# Patient Record
Sex: Male | Born: 1950 | Race: Black or African American | Hispanic: No | Marital: Single | Smoking: Former smoker
Health system: Southern US, Community
[De-identification: ages and names within clinical notes are randomized; demographics above are authoritative.]

## PROBLEM LIST (undated history)

## (undated) DIAGNOSIS — M1990 Unspecified osteoarthritis, unspecified site: Secondary | ICD-10-CM

---

## 2001-11-07 ENCOUNTER — Encounter: Payer: Self-pay | Admitting: Family Medicine

## 2001-11-07 ENCOUNTER — Ambulatory Visit (HOSPITAL_COMMUNITY): Admission: RE | Admit: 2001-11-07 | Discharge: 2001-11-07 | Payer: Self-pay | Admitting: Family Medicine

## 2003-07-18 ENCOUNTER — Ambulatory Visit (HOSPITAL_COMMUNITY): Admission: RE | Admit: 2003-07-18 | Discharge: 2003-07-18 | Payer: Self-pay | Admitting: Internal Medicine

## 2003-07-18 ENCOUNTER — Encounter: Payer: Self-pay | Admitting: Internal Medicine

## 2003-07-18 ENCOUNTER — Encounter (INDEPENDENT_AMBULATORY_CARE_PROVIDER_SITE_OTHER): Payer: Self-pay | Admitting: Specialist

## 2003-11-23 ENCOUNTER — Emergency Department (HOSPITAL_COMMUNITY): Admission: EM | Admit: 2003-11-23 | Discharge: 2003-11-23 | Payer: Self-pay | Admitting: Emergency Medicine

## 2004-10-25 ENCOUNTER — Ambulatory Visit: Payer: Self-pay | Admitting: Internal Medicine

## 2004-11-09 ENCOUNTER — Ambulatory Visit: Payer: Self-pay | Admitting: Internal Medicine

## 2004-11-19 ENCOUNTER — Ambulatory Visit: Payer: Self-pay | Admitting: Internal Medicine

## 2004-12-03 ENCOUNTER — Ambulatory Visit: Payer: Self-pay | Admitting: Internal Medicine

## 2004-12-24 ENCOUNTER — Ambulatory Visit: Payer: Self-pay | Admitting: Internal Medicine

## 2004-12-31 ENCOUNTER — Ambulatory Visit: Payer: Self-pay | Admitting: Internal Medicine

## 2005-01-07 ENCOUNTER — Ambulatory Visit: Payer: Self-pay | Admitting: Internal Medicine

## 2005-01-14 ENCOUNTER — Ambulatory Visit: Payer: Self-pay | Admitting: Internal Medicine

## 2005-01-21 ENCOUNTER — Ambulatory Visit: Payer: Self-pay | Admitting: Internal Medicine

## 2005-01-28 ENCOUNTER — Ambulatory Visit: Payer: Self-pay | Admitting: Internal Medicine

## 2005-02-04 ENCOUNTER — Ambulatory Visit: Payer: Self-pay | Admitting: Internal Medicine

## 2005-02-07 ENCOUNTER — Ambulatory Visit: Payer: Self-pay | Admitting: Internal Medicine

## 2005-02-11 ENCOUNTER — Ambulatory Visit: Payer: Self-pay | Admitting: Internal Medicine

## 2005-02-21 ENCOUNTER — Ambulatory Visit: Payer: Self-pay | Admitting: Internal Medicine

## 2005-02-25 ENCOUNTER — Ambulatory Visit: Payer: Self-pay | Admitting: Internal Medicine

## 2005-03-04 ENCOUNTER — Ambulatory Visit: Payer: Self-pay | Admitting: Internal Medicine

## 2005-03-11 ENCOUNTER — Ambulatory Visit: Payer: Self-pay | Admitting: Internal Medicine

## 2005-03-18 ENCOUNTER — Ambulatory Visit: Payer: Self-pay | Admitting: Internal Medicine

## 2005-03-25 ENCOUNTER — Ambulatory Visit: Payer: Self-pay | Admitting: Internal Medicine

## 2005-03-31 ENCOUNTER — Ambulatory Visit: Payer: Self-pay | Admitting: Internal Medicine

## 2005-04-08 ENCOUNTER — Ambulatory Visit: Payer: Self-pay | Admitting: Internal Medicine

## 2005-04-15 ENCOUNTER — Ambulatory Visit: Payer: Self-pay | Admitting: Internal Medicine

## 2005-04-19 ENCOUNTER — Ambulatory Visit: Payer: Self-pay | Admitting: *Deleted

## 2005-04-22 ENCOUNTER — Ambulatory Visit: Payer: Self-pay | Admitting: Internal Medicine

## 2005-04-29 ENCOUNTER — Ambulatory Visit: Payer: Self-pay | Admitting: Internal Medicine

## 2005-05-06 ENCOUNTER — Ambulatory Visit: Payer: Self-pay | Admitting: Internal Medicine

## 2005-05-13 ENCOUNTER — Ambulatory Visit: Payer: Self-pay | Admitting: Internal Medicine

## 2005-07-22 ENCOUNTER — Ambulatory Visit: Payer: Self-pay | Admitting: Internal Medicine

## 2005-07-26 ENCOUNTER — Ambulatory Visit: Payer: Self-pay | Admitting: Internal Medicine

## 2005-09-20 ENCOUNTER — Ambulatory Visit: Payer: Self-pay | Admitting: Internal Medicine

## 2007-06-26 ENCOUNTER — Emergency Department (HOSPITAL_COMMUNITY): Admission: EM | Admit: 2007-06-26 | Discharge: 2007-06-26 | Payer: Self-pay | Admitting: Emergency Medicine

## 2007-07-05 ENCOUNTER — Ambulatory Visit: Payer: Self-pay | Admitting: Internal Medicine

## 2007-09-05 ENCOUNTER — Encounter (INDEPENDENT_AMBULATORY_CARE_PROVIDER_SITE_OTHER): Payer: Self-pay | Admitting: *Deleted

## 2007-12-21 ENCOUNTER — Emergency Department (HOSPITAL_COMMUNITY): Admission: EM | Admit: 2007-12-21 | Discharge: 2007-12-21 | Payer: Self-pay | Admitting: Emergency Medicine

## 2008-01-07 ENCOUNTER — Emergency Department (HOSPITAL_COMMUNITY): Admission: EM | Admit: 2008-01-07 | Discharge: 2008-01-07 | Payer: Self-pay | Admitting: Family Medicine

## 2008-07-04 ENCOUNTER — Emergency Department (HOSPITAL_COMMUNITY): Admission: EM | Admit: 2008-07-04 | Discharge: 2008-07-04 | Payer: Self-pay | Admitting: Emergency Medicine

## 2009-03-26 ENCOUNTER — Emergency Department (HOSPITAL_COMMUNITY): Admission: EM | Admit: 2009-03-26 | Discharge: 2009-03-26 | Payer: Self-pay | Admitting: Emergency Medicine

## 2009-05-31 ENCOUNTER — Emergency Department (HOSPITAL_BASED_OUTPATIENT_CLINIC_OR_DEPARTMENT_OTHER): Admission: EM | Admit: 2009-05-31 | Discharge: 2009-05-31 | Payer: Self-pay | Admitting: Emergency Medicine

## 2009-05-31 ENCOUNTER — Ambulatory Visit: Payer: Self-pay | Admitting: Interventional Radiology

## 2009-06-15 ENCOUNTER — Ambulatory Visit: Payer: Self-pay | Admitting: Internal Medicine

## 2009-07-10 ENCOUNTER — Ambulatory Visit: Payer: Self-pay | Admitting: Internal Medicine

## 2009-09-08 ENCOUNTER — Encounter: Payer: Self-pay | Admitting: Internal Medicine

## 2010-01-27 ENCOUNTER — Ambulatory Visit: Payer: Self-pay | Admitting: Internal Medicine

## 2010-01-27 LAB — CONVERTED CEMR LAB
AST: 34 units/L (ref 0–37)
Alkaline Phosphatase: 86 units/L (ref 39–117)
BUN: 13 mg/dL (ref 6–23)
Calcium: 8.8 mg/dL (ref 8.4–10.5)
Chloride: 103 meq/L (ref 96–112)
Creatinine, Ser: 0.86 mg/dL (ref 0.40–1.50)
TIBC: 307 ug/dL (ref 215–435)
UIBC: 261 ug/dL

## 2010-03-03 ENCOUNTER — Ambulatory Visit: Payer: Self-pay | Admitting: Internal Medicine

## 2011-03-30 LAB — URINE CULTURE: Colony Count: >=100000

## 2011-03-30 LAB — COMPREHENSIVE METABOLIC PANEL
Alkaline Phosphatase: 98 U/L (ref 39–117)
BUN: 11 mg/dL (ref 6–23)
CO2: 26 mEq/L (ref 19–32)
Chloride: 101 mEq/L (ref 96–112)
Creatinine, Ser: 0.96 mg/dL (ref 0.4–1.5)
GFR calc non Af Amer: >60 mL/min (ref >60)
Glucose, Bld: 136 mg/dL — ABNORMAL HIGH (ref 70–99)
Potassium: 3.9 mEq/L (ref 3.5–5.1)
Total Bilirubin: 0.8 mg/dL (ref 0.3–1.2)

## 2011-03-30 LAB — URINE MICROSCOPIC-ADD ON

## 2011-03-30 LAB — DIFFERENTIAL
Basophils Absolute: 0 10*3/uL (ref 0.0–0.1)
Basophils Relative: 0 % (ref 0–1)
Lymphocytes Relative: 22 % (ref 12–46)
Neutro Abs: 5.7 10*3/uL (ref 1.7–7.7)
Neutrophils Relative %: 67 % (ref 43–77)

## 2011-03-30 LAB — CBC
HCT: 47.3 % (ref 39.0–52.0)
Hemoglobin: 16.3 g/dL (ref 13.0–17.0)
MCV: 93.3 fL (ref 78.0–100.0)
Platelets: 172 10*3/uL (ref 150–400)
RBC: 5.07 MIL/uL (ref 4.22–5.81)
WBC: 8.5 10*3/uL (ref 4.0–10.5)

## 2011-03-30 LAB — URINALYSIS, ROUTINE W REFLEX MICROSCOPIC
Bilirubin Urine: NEGATIVE
Nitrite: POSITIVE — AB
Specific Gravity, Urine: 1.018 (ref 1.005–1.030)
pH: 5.5 (ref 5.0–8.0)

## 2011-03-30 LAB — LIPASE, BLOOD: Lipase: 25 U/L (ref 11–59)

## 2011-06-27 ENCOUNTER — Emergency Department (HOSPITAL_COMMUNITY): Payer: Self-pay

## 2011-06-27 ENCOUNTER — Emergency Department (HOSPITAL_COMMUNITY)
Admission: EM | Admit: 2011-06-27 | Discharge: 2011-06-27 | Disposition: A | Discharge status: Home / Self Care | Payer: Self-pay | Attending: Emergency Medicine | Admitting: Emergency Medicine

## 2011-06-27 DIAGNOSIS — IMO0001 Reserved for inherently not codable concepts without codable children: Secondary | ICD-10-CM | POA: Insufficient documentation

## 2011-06-27 DIAGNOSIS — R059 Cough, unspecified: Secondary | ICD-10-CM | POA: Insufficient documentation

## 2011-06-27 DIAGNOSIS — J40 Bronchitis, not specified as acute or chronic: Secondary | ICD-10-CM | POA: Insufficient documentation

## 2011-06-27 DIAGNOSIS — K7460 Unspecified cirrhosis of liver: Secondary | ICD-10-CM | POA: Insufficient documentation

## 2011-06-27 DIAGNOSIS — R05 Cough: Secondary | ICD-10-CM | POA: Insufficient documentation

## 2011-06-27 DIAGNOSIS — K759 Inflammatory liver disease, unspecified: Secondary | ICD-10-CM | POA: Insufficient documentation

## 2011-09-16 LAB — URINALYSIS, ROUTINE W REFLEX MICROSCOPIC
Bilirubin Urine: NEGATIVE
Hgb urine dipstick: NEGATIVE
Nitrite: POSITIVE — AB
Protein, ur: NEGATIVE
Specific Gravity, Urine: 1.02
Urobilinogen, UA: 2 — ABNORMAL HIGH

## 2011-09-16 LAB — URINE CULTURE: Colony Count: >=100000

## 2011-09-16 LAB — URINE MICROSCOPIC-ADD ON

## 2013-07-23 ENCOUNTER — Emergency Department (HOSPITAL_COMMUNITY)
Admission: EM | Admit: 2013-07-23 | Discharge: 2013-07-23 | Disposition: A | Discharge status: Home / Self Care | Payer: No Typology Code available for payment source | Source: Home / Self Care

## 2013-07-23 ENCOUNTER — Encounter (HOSPITAL_COMMUNITY): Payer: Self-pay | Admitting: Emergency Medicine

## 2013-07-23 DIAGNOSIS — N402 Nodular prostate without lower urinary tract symptoms: Secondary | ICD-10-CM

## 2013-07-23 DIAGNOSIS — R3989 Other symptoms and signs involving the genitourinary system: Secondary | ICD-10-CM

## 2013-07-23 DIAGNOSIS — R39198 Other difficulties with micturition: Secondary | ICD-10-CM

## 2013-07-23 LAB — POCT URINALYSIS DIP (DEVICE)
Glucose, UA: NEGATIVE mg/dL
Protein, ur: NEGATIVE mg/dL
Specific Gravity, Urine: 1.02 (ref 1.005–1.030)
Urobilinogen, UA: 1 mg/dL (ref 0.0–1.0)

## 2013-07-23 MED ORDER — TAMSULOSIN HCL 0.4 MG PO CAPS: 0.4000 mg | ORAL_CAPSULE | Freq: Every day | ORAL | Status: DC

## 2013-07-23 MED ORDER — CEPHALEXIN 500 MG PO CAPS: 500.0000 mg | ORAL_CAPSULE | Freq: Two times a day (BID) | ORAL | Status: DC

## 2013-07-23 NOTE — ED Notes (Signed)
C/o difficulty urinating which started Sunday night.  Referral for urologist

## 2013-07-23 NOTE — ED Provider Notes (Signed)
Devon Wall is a 62 y.o. male who presents to Urgent Care today for difficulty urinating over the last several years worse recently. Patient has poor dentition and is currently on hydrocodone from his dentist. He notes on Monday he had difficulty urinating.  He had to strain to urinate. He stopped taking hydrocodone and notes that he is now able to pee more easily. He still has to strain somewhat. He denies any fevers or chills abdominal pain nausea vomiting or diarrhea. He denies any personal past history of prostate enlargement. No significant pain. Well otherwise.    PMH reviewed. Healthy otherwise History  Substance Use Topics  . Smoking status: Not on file  . Smokeless tobacco: Not on file  . Alcohol Use: Not on file   ROS as above Medications reviewed. No current facility-administered medications for this encounter.   Current Outpatient Prescriptions  Medication Sig Dispense Refill  . cephALEXin (KEFLEX) 500 MG capsule Take 1 capsule (500 mg total) by mouth 2 (two) times daily.  14 capsule  0  . tamsulosin (FLOMAX) 0.4 MG CAPS capsule Take 1 capsule (0.4 mg total) by mouth daily.  30 capsule  0    Exam:  BP 123/92  Pulse 90  Temp(Src) 98.4 F (36.9 C) (Oral)  Resp 20  SpO2 98% Gen: Well NAD HEENT: EOMI,  MMM Lungs: CTABL Nl WOB Heart: RRR no MRG Abd: NABS, NT, ND Exts: Non edematous BL  LE, warm and well perfused.  Rectal: Large firm nodule in the left lobe of the prostate. The overall prostate size is also enlarged. Nontender.   Results for orders placed during the hospital encounter of 07/23/13 (from the past 24 hour(s))  POCT URINALYSIS DIP (DEVICE)     Status: Abnormal   Collection Time    07/23/13  4:24 PM      Result Value Range   Glucose, UA NEGATIVE  NEGATIVE mg/dL   Bilirubin Urine NEGATIVE  NEGATIVE   Ketones, ur NEGATIVE  NEGATIVE mg/dL   Specific Gravity, Urine 1.020  1.005 - 1.030   Hgb urine dipstick TRACE (*) NEGATIVE   pH 6.0  5.0 - 8.0   Protein, ur NEGATIVE  NEGATIVE mg/dL   Urobilinogen, UA 1.0  0.0 - 1.0 mg/dL   Nitrite POSITIVE (*) NEGATIVE   Leukocytes, UA SMALL (*) NEGATIVE   No results found.  Assessment and Plan: 62 y.o. male with difficulty urinating with a large prostate nodule. He likely has prostate enlargement which has resulted in his difficulty urinating over the last several years. His recent difficulty urinating was likely worsened by the hydrocodone which he is no longer taking. Additionally he has what appears to be a urinary tract infection.  Plan:  Start Flomax Treat urinary tract infection empirically with Keflex and obtain a urine culture.  Refer to urology for workup of nodule.  Discussed the plan with the patient expresses understanding and agree     Rodolph Bong, MD 07/23/13 [phone]

## 2013-07-25 LAB — URINE CULTURE: Colony Count: >=100000

## 2013-07-26 ENCOUNTER — Telehealth (HOSPITAL_COMMUNITY): Payer: Self-pay | Admitting: Family Medicine

## 2013-07-26 MED ORDER — CEFDINIR 300 MG PO CAPS: 300.0000 mg | ORAL_CAPSULE | Freq: Two times a day (BID) | ORAL | Status: DC

## 2013-07-26 NOTE — ED Notes (Signed)
Attempted to get in contact with the patient regarding urine culture.  Left a message. Called in [place] as the Ecoli is resistant to Keflex.   Rodolph Bong, MD 07/26/13 [phone]

## 2013-07-28 ENCOUNTER — Telehealth (HOSPITAL_COMMUNITY): Payer: Self-pay | Admitting: *Deleted

## 2013-07-28 NOTE — ED Notes (Signed)
Urine culture: >100,000 colonies E. Coli.  Pt. treated with Keflex- resistant. 8/7 Message to Dr. Denyse Amass.  8/8 Dr. Denyse Amass changed Rx. to [facility]. and attempted to call pt.  8/10  I called pt. today but both numbers are invalid ( home number is disconnected and mobile number was verified as incorrect by person that answered the phone.)  There is no contact number listed. I wrote a letter with lab result, Rx. change, and where to pick it up.  It will go out tomorrow morning.  Dr. Denyse Amass notified. I also called [facility] and they do not have any other number to call pt. Vassie Moselle 07/28/2013

## 2014-10-09 ENCOUNTER — Emergency Department (HOSPITAL_COMMUNITY): Payer: No Typology Code available for payment source

## 2014-10-09 ENCOUNTER — Inpatient Hospital Stay (HOSPITAL_COMMUNITY)
Admission: EM | Admit: 2014-10-09 | Discharge: 2014-10-13 | DRG: 871 | Disposition: A | Discharge status: Home / Self Care | Payer: No Typology Code available for payment source | Attending: Internal Medicine | Admitting: Internal Medicine

## 2014-10-09 ENCOUNTER — Encounter (HOSPITAL_COMMUNITY): Payer: Self-pay | Admitting: Emergency Medicine

## 2014-10-09 DIAGNOSIS — A409 Streptococcal sepsis, unspecified: Principal | ICD-10-CM | POA: Diagnosis present

## 2014-10-09 DIAGNOSIS — E43 Unspecified severe protein-calorie malnutrition: Secondary | ICD-10-CM | POA: Diagnosis present

## 2014-10-09 DIAGNOSIS — J449 Chronic obstructive pulmonary disease, unspecified: Secondary | ICD-10-CM | POA: Diagnosis present

## 2014-10-09 DIAGNOSIS — F1290 Cannabis use, unspecified, uncomplicated: Secondary | ICD-10-CM | POA: Diagnosis present

## 2014-10-09 DIAGNOSIS — R0902 Hypoxemia: Secondary | ICD-10-CM

## 2014-10-09 DIAGNOSIS — A419 Sepsis, unspecified organism: Secondary | ICD-10-CM

## 2014-10-09 DIAGNOSIS — R05 Cough: Secondary | ICD-10-CM | POA: Diagnosis present

## 2014-10-09 DIAGNOSIS — J9601 Acute respiratory failure with hypoxia: Secondary | ICD-10-CM | POA: Diagnosis present

## 2014-10-09 DIAGNOSIS — Y95 Nosocomial condition: Secondary | ICD-10-CM | POA: Diagnosis present

## 2014-10-09 DIAGNOSIS — E119 Type 2 diabetes mellitus without complications: Secondary | ICD-10-CM | POA: Diagnosis present

## 2014-10-09 DIAGNOSIS — Z681 Body mass index (BMI) 19 or less, adult: Secondary | ICD-10-CM

## 2014-10-09 DIAGNOSIS — J154 Pneumonia due to other streptococci: Secondary | ICD-10-CM | POA: Diagnosis present

## 2014-10-09 DIAGNOSIS — E46 Unspecified protein-calorie malnutrition: Secondary | ICD-10-CM

## 2014-10-09 DIAGNOSIS — J189 Pneumonia, unspecified organism: Secondary | ICD-10-CM

## 2014-10-09 DIAGNOSIS — A403 Sepsis due to Streptococcus pneumoniae: Secondary | ICD-10-CM

## 2014-10-09 DIAGNOSIS — R6510 Systemic inflammatory response syndrome (SIRS) of non-infectious origin without acute organ dysfunction: Secondary | ICD-10-CM

## 2014-10-09 DIAGNOSIS — R059 Cough, unspecified: Secondary | ICD-10-CM

## 2014-10-09 LAB — CBC WITH DIFFERENTIAL/PLATELET
BASOS ABS: 0 10*3/uL (ref 0.0–0.1)
BASOS PCT: 0 % (ref 0–1)
Eosinophils Absolute: 0 10*3/uL (ref 0.0–0.7)
Eosinophils Relative: 0 % (ref 0–5)
HCT: 39.8 % (ref 39.0–52.0)
Hemoglobin: 13.8 g/dL (ref 13.0–17.0)
Lymphocytes Relative: 9 % — ABNORMAL LOW (ref 12–46)
Lymphs Abs: 2 10*3/uL (ref 0.7–4.0)
MCH: 32.5 pg (ref 26.0–34.0)
MCHC: 34.7 g/dL (ref 30.0–36.0)
MCV: 93.6 fL (ref 78.0–100.0)
Monocytes Absolute: 1.4 10*3/uL — ABNORMAL HIGH (ref 0.1–1.0)
Monocytes Relative: 6 % (ref 3–12)
NEUTROS ABS: 20.4 10*3/uL — AB (ref 1.7–7.7)
NEUTROS PCT: 85 % — AB (ref 43–77)
PLATELETS: 259 10*3/uL (ref 150–400)
RBC: 4.25 MIL/uL (ref 4.22–5.81)
RDW: 12.9 % (ref 11.5–15.5)
WBC: 23.8 10*3/uL — ABNORMAL HIGH (ref 4.0–10.5)

## 2014-10-09 LAB — COMPREHENSIVE METABOLIC PANEL
ALBUMIN: 3.2 g/dL — AB (ref 3.5–5.2)
ALK PHOS: 84 U/L (ref 39–117)
ALT: 18 U/L (ref 0–53)
AST: 23 U/L (ref 0–37)
Anion gap: 14 (ref 5–15)
BILIRUBIN TOTAL: 0.6 mg/dL (ref 0.3–1.2)
BUN: 11 mg/dL (ref 6–23)
CHLORIDE: 99 meq/L (ref 96–112)
CO2: 25 mEq/L (ref 19–32)
Calcium: 9.2 mg/dL (ref 8.4–10.5)
Creatinine, Ser: 0.72 mg/dL (ref 0.50–1.35)
GFR calc Af Amer: >90 mL/min (ref >90)
GFR calc non Af Amer: >90 mL/min (ref >90)
Glucose, Bld: 125 mg/dL — ABNORMAL HIGH (ref 70–99)
POTASSIUM: 4.1 meq/L (ref 3.7–5.3)
Sodium: 138 mEq/L (ref 137–147)
Total Protein: 8.3 g/dL (ref 6.0–8.3)

## 2014-10-09 LAB — ETHANOL: Alcohol, Ethyl (B): <11 mg/dL (ref 0–11)

## 2014-10-09 LAB — TROPONIN I

## 2014-10-09 LAB — I-STAT CG4 LACTIC ACID, ED: Lactic Acid, Venous: 2.29 mmol/L — ABNORMAL HIGH (ref 0.5–2.2)

## 2014-10-09 MED ORDER — SODIUM CHLORIDE 0.9 % IV BOLUS (SEPSIS)
1000.0000 mL | Freq: Once | INTRAVENOUS | Status: AC
  Administered 2014-10-09: 1000 mL via INTRAVENOUS

## 2014-10-09 MED ORDER — DEXTROSE 5 % IV SOLN
1.0000 g | Freq: Once | INTRAVENOUS | Status: AC
  Administered 2014-10-09: 1 g via INTRAVENOUS
  Filled 2014-10-09: qty 10

## 2014-10-09 MED ORDER — DEXTROSE 5 % IV SOLN
500.0000 mg | Freq: Once | INTRAVENOUS | Status: DC
  Filled 2014-10-09: qty 500

## 2014-10-09 NOTE — ED Provider Notes (Signed)
CSN: [id number]     Arrival date & time 10/09/14  1718 History   First MD Initiated Contact with Patient 10/09/14 2026     Chief Complaint  Patient presents with  . Back Pain  . Cough     (Consider location/radiation/quality/duration/timing/severity/associated sxs/prior Treatment) The history is provided by the patient. No language interpreter was used.  Katrine CohoJames Denison is a 63 y/o M with no known significant PMHx presenting to the ED with cough that has been ongoing for 1-2 weeks. Patient reported that when he coughs it is mainly dry. Reported that he has been having shortness of breath associated with the cough and stated that over the past couple of days he has been having chest pain and left sided back pain associated with cough. Stated that he has been feeling extremely weak and fatigued. Denied cigarette use, marijuana, heroin, cocaine. Denied alcohol use. Denied fever, chills, neck pain, neck stiffness, nausea, vomiting, diarrhea, change eating habits, changes to bowel movements, abdominal pain. PCP none  History reviewed. No pertinent past medical history. History reviewed. No pertinent past surgical history. History reviewed. No pertinent family history. History  Substance Use Topics  . Smoking status: Not on file  . Smokeless tobacco: Not on file  . Alcohol Use: Not on file    Review of Systems  Constitutional: Negative for fever and chills.  Respiratory: Positive for cough, chest tightness and shortness of breath.   Cardiovascular: Positive for chest pain.  Gastrointestinal: Negative for nausea, vomiting and abdominal pain.  Musculoskeletal: Positive for back pain. Negative for neck pain and neck stiffness.  Neurological: Negative for dizziness, weakness and headaches.      Allergies  Review of patient's allergies indicates no known allergies.  Home Medications   Prior to Admission medications   Not on File   BP 106/62  Pulse 95  Temp(Src) 100.5 F (38.1 C)  (Oral)  Resp 30  Ht 6\' 2"  (1.88 m)  SpO2 99% Physical Exam  Nursing note and vitals reviewed. Constitutional: He is oriented to person, place, and time. He appears well-developed and well-nourished. No distress.  HENT:  Head: Normocephalic and atraumatic.  Mouth/Throat: Oropharynx is clear and moist. No oropharyngeal exudate.  Eyes: Conjunctivae and EOM are normal. Pupils are equal, round, and reactive to light. Right eye exhibits no discharge. Left eye exhibits no discharge.  Neck: Normal range of motion. Neck supple. No tracheal deviation present.  Cardiovascular: Regular rhythm and normal heart sounds.  Tachycardia present.  Exam reveals no friction rub.   No murmur heard. Pulses:      Radial pulses are 2+ on the right side, and 2+ on the left side.  Tachycardia upon auscultation  Pulmonary/Chest: Effort normal. No respiratory distress. He has no wheezes. He has no rales. He exhibits tenderness.  Decreased breath sounds upper lower lobes bilaterally Patient is able to speak in full sentences without difficulty Negative use of accessory muscles Negative stridor  Musculoskeletal: Normal range of motion.  Lymphadenopathy:    He has no cervical adenopathy.  Neurological: He is alert and oriented to person, place, and time. No cranial nerve deficit. He exhibits normal muscle tone. Coordination normal.  Skin: Skin is warm and dry. No rash noted. He is not diaphoretic. No erythema.  Psychiatric: He has a normal mood and affect. His behavior is normal. Thought content normal.    ED Course  Procedures (including critical care time) Labs Review Labs Reviewed  CBC WITH DIFFERENTIAL - Abnormal; Notable for the following:  WBC 23.8 (*)    Neutrophils Relative % 85 (*)    Neutro Abs 20.4 (*)    Lymphocytes Relative 9 (*)    Monocytes Absolute 1.4 (*)    All other components within normal limits  COMPREHENSIVE METABOLIC PANEL - Abnormal; Notable for the following:    Glucose, Bld 125  (*)    Albumin 3.2 (*)    All other components within normal limits  I-STAT CG4 LACTIC ACID, ED - Abnormal; Notable for the following:    Lactic Acid, Venous 2.29 (*)    All other components within normal limits  CULTURE, BLOOD (ROUTINE X 2)  CULTURE, BLOOD (ROUTINE X 2)  CULTURE, BLOOD (ROUTINE X 2)  CULTURE, BLOOD (ROUTINE X 2)  CULTURE, EXPECTORATED SPUTUM-ASSESSMENT  GRAM STAIN  INFLUENZA ANTIGEN  TROPONIN I  ETHANOL  HIV ANTIBODY (ROUTINE TESTING)  LEGIONELLA ANTIGEN, URINE  STREP PNEUMONIAE URINARY ANTIGEN  CBC  CREATININE, SERUM  BASIC METABOLIC PANEL  CBC WITH DIFFERENTIAL    Imaging Review Dg Chest 2 View  10/09/2014   CLINICAL DATA:  Cough with left-sided chest pain and fever. Initial encounter.  EXAM: CHEST  2 VIEW  COMPARISON:  06/27/2011.  FINDINGS: The heart size and mediastinal contours are stable. The lungs are hyperinflated. There is new patchy airspace disease at the left lung base suspicious for pneumonia, possibly aspiration. Scattered nodularity in the upper lobes is stable, likely related to prior granulomatous disease. No significant pleural effusion is seen. The osseous structures appear unchanged.  IMPRESSION: New patchy left lower lobe airspace disease worrisome for pneumonia, possibly on the basis of aspiration. 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   Electronically Signed   By: Roxy HorsemanBill  Veazey M.D.   On: 10/09/2014 19:49     EKG Interpretation None     11:10 PM This provider spoke with Dr. Vania Rea. David - discussed case, labs, imaging, vitals, ED course in great detail. Patient to be admitted to Telemetry as inpatient.   MDM   Final diagnoses:  Community acquired pneumonia  Sepsis, due to unspecified organism    Medications  enoxaparin (LOVENOX) injection 40 mg (not administered)  0.9 %  sodium chloride infusion ( Intravenous New Bag/Given 10/10/14 0137)  cefTRIAXone (ROCEPHIN) 1 g in dextrose 5 % 50  mL IVPB (not administered)  azithromycin (ZITHROMAX) 500 mg in dextrose 5 % 250 mL IVPB (500 mg Intravenous Given 10/10/14 0138)  ipratropium-albuterol (DUONEB) 0.5-2.5 (3) MG/3ML nebulizer solution 3 mL (not administered)  Influenza vac split quadrivalent PF (FLUARIX) injection 0.5 mL (not administered)  sodium chloride 0.9 % bolus 1,000 mL (1,000 mLs Intravenous New Bag/Given 10/09/14 2242)  cefTRIAXone (ROCEPHIN) 1 g in dextrose 5 % 50 mL IVPB (1 g Intravenous New Bag/Given 10/09/14 2242)   Filed Vitals:   10/09/14 2345 10/10/14 0000 10/10/14 0042 10/10/14 0112  BP: 129/87 129/90 106/62   Pulse: 99 95 95   Temp:   100.5 F (38.1 C)   TempSrc:   Oral   Resp: 40 34 30   Height:    6\' 2"  (1.88 m)  SpO2: 100% 100% 99%    EKG noted normal sinus rhythm with heart rate 91 bpm. Troponin negative elevation. CBC elevated white blood cell count of 23.8. CMP unremarkable. Ethanol negative elevation. Lactic acid elevated at 2.29. Chest x-ray noted new patchy left lower lobe airspace disease worrisome for pneumonia, possible aspiration-underlying chronic obstructive lung disease with evidence of prior granulomatous disease. Blood cultures pending.  While in the ED setting, patient became tachypneic with a respiratory rate of 24, when excited patient increased to as high as 45, but when calmed down went down to 24. Patient continues to be mildly tachycardic with heart rate of 101 bpm. Patient's pulse ox decreased to 88% on room air. Started to develop fever of 100.43F - patient presenting to be septic. Patient placed on IV antibiotics for CAP and oxygen therapy via nasal cannula. Patient seen and assessed by attending physician who agrees to plan of admission. Patient agreed to admission. Spoke with [facility] - patient to be admitted to Telemetry floor for admission. Admitted for CAP with hypoxia and sepsis.   Raymon Mutton, PA-C 10/10/14 0147

## 2014-10-09 NOTE — ED Notes (Signed)
Per downtime paperwork done at 1657. Pt believes he has pnuemonia because he has had it before. Complains of back pain and cough since this am. Also weak. Pt ambulated independently into triage.

## 2014-10-09 NOTE — H&P (Signed)
  Chief Complaint:  cough  HPI: 63 yo male with 2 weeks of coughing and not feeling well.  Just feels "awful".  No fevers or chills.  No n/v/d.  Some chest pain with coughing no le edeam or swelling.  Feels better after receiving some abx and ivf in the ED.    Review of Systems:  Positive and negative as per HPI otherwise all other systems are negative  Past Medical History: History reviewed. No pertinent past medical history. History reviewed. No pertinent past surgical history.  Medications: Prior to Admission medications   Not on File    Allergies:  No Known Allergies  Social History: Neg x 3  Family History: History reviewed. No pertinent family history.  Physical Exam: Filed Vitals:   10/09/14 1724 10/09/14 2117 10/09/14 2248  BP: 130/76 128/77   Pulse: 111 90   Temp: 98.8 F (37.1 C) 99.9 F (37.7 C) 100.8 F (38.2 C)  TempSrc: Oral Oral Rectal  Resp: 20 26   SpO2: 92% 96%    General appearance: alert, cooperative and no distress Head: Normocephalic, without obvious abnormality, atraumatic Eyes: negative Nose: Nares normal. Septum midline. Mucosa normal. No drainage or sinus tenderness. Neck: no JVD and supple, symmetrical, trachea midline Lungs: diminished breath sounds bilaterally Heart: regular rate and rhythm, S1, S2 normal, no murmur, click, rub or gallop Abdomen: soft, non-tender; bowel sounds normal; no masses,  no organomegaly Extremities: extremities normal, atraumatic, no cyanosis or edema Pulses: 2+ and symmetric Skin: Skin color, texture, turgor normal. No rashes or lesions Neurologic: Grossly normal    Labs on Admission:   Recent Labs  10/09/14 2118  NA 138  K 4.1  CL 99  CO2 25  GLUCOSE 125*  BUN 11  CREATININE 0.72  CALCIUM 9.2    Recent Labs  10/09/14 2118  AST 23  ALT 18  ALKPHOS 84  BILITOT 0.6  PROT 8.3  ALBUMIN 3.2*    Recent Labs  10/09/14 2118  WBC 23.8*  NEUTROABS 20.4*  HGB 13.8  HCT 39.8  MCV 93.6   PLT 259    Recent Labs  10/09/14 2118  TROPONINI <0.30   Radiological Exams on Admission: Dg Chest 2 View  10/09/2014   CLINICAL DATA:  Cough with left-sided chest pain and fever. Initial encounter.  EXAM: CHEST  2 VIEW  COMPARISON:  06/27/2011.  FINDINGS: The heart size and mediastinal contours are stable. The lungs are hyperinflated. There is new patchy airspace disease at the left lung base suspicious for pneumonia, possibly aspiration. Scattered nodularity in the upper lobes is stable, likely related to prior granulomatous disease. No significant pleural effusion is seen. The osseous structures appear unchanged.  IMPRESSION: New patchy left lower lobe airspace disease worrisome for pneumonia, possibly on the basis of aspiration. 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   Electronically Signed   By: Roxy HorsemanBill  Veazey M.D.   On: 10/09/2014 19:49    Assessment/Plan  63 yo male with CAP and mild hypoxia  Principal Problem:   CAP (community acquired pneumonia)-  pna pathway, rocephin and azithro.  Oxygen as needed.  duonebs prn.    Active Problems:   Cough   SIRS (systemic inflammatory response syndrome)   Hypoxia  Full code.  Admit to tele.    Andreina Outten A 10/09/2014, 11:16 PM

## 2014-10-10 ENCOUNTER — Encounter (HOSPITAL_COMMUNITY): Payer: Self-pay | Admitting: *Deleted

## 2014-10-10 DIAGNOSIS — E46 Unspecified protein-calorie malnutrition: Secondary | ICD-10-CM

## 2014-10-10 LAB — CBC WITH DIFFERENTIAL/PLATELET
Basophils Absolute: 0 10*3/uL (ref 0.0–0.1)
Basophils Relative: 0 % (ref 0–1)
EOS PCT: 0 % (ref 0–5)
Eosinophils Absolute: 0 10*3/uL (ref 0.0–0.7)
HCT: 36.9 % — ABNORMAL LOW (ref 39.0–52.0)
HEMOGLOBIN: 12.5 g/dL — AB (ref 13.0–17.0)
LYMPHS PCT: 10 % — AB (ref 12–46)
Lymphs Abs: 2.3 10*3/uL (ref 0.7–4.0)
MCH: 31.6 pg (ref 26.0–34.0)
MCHC: 33.9 g/dL (ref 30.0–36.0)
MCV: 93.2 fL (ref 78.0–100.0)
MONOS PCT: 10 % (ref 3–12)
Monocytes Absolute: 2.1 10*3/uL — ABNORMAL HIGH (ref 0.1–1.0)
NEUTROS ABS: 18.1 10*3/uL — AB (ref 1.7–7.7)
Neutrophils Relative %: 80 % — ABNORMAL HIGH (ref 43–77)
Platelets: 266 10*3/uL (ref 150–400)
RBC: 3.96 MIL/uL — ABNORMAL LOW (ref 4.22–5.81)
RDW: 12.8 % (ref 11.5–15.5)
WBC: 22.6 10*3/uL — ABNORMAL HIGH (ref 4.0–10.5)

## 2014-10-10 LAB — RAPID URINE DRUG SCREEN, HOSP PERFORMED
AMPHETAMINES: NOT DETECTED
Barbiturates: NOT DETECTED
Benzodiazepines: NOT DETECTED
COCAINE: NOT DETECTED
OPIATES: NOT DETECTED
TETRAHYDROCANNABINOL: POSITIVE — AB

## 2014-10-10 LAB — STREP PNEUMONIAE URINARY ANTIGEN: STREP PNEUMO URINARY ANTIGEN: POSITIVE — AB

## 2014-10-10 LAB — BASIC METABOLIC PANEL
ANION GAP: 12 (ref 5–15)
BUN: 11 mg/dL (ref 6–23)
CHLORIDE: 101 meq/L (ref 96–112)
CO2: 25 meq/L (ref 19–32)
CREATININE: 0.7 mg/dL (ref 0.50–1.35)
Calcium: 8.7 mg/dL (ref 8.4–10.5)
GFR calc Af Amer: >90 mL/min (ref >90)
GFR calc non Af Amer: >90 mL/min (ref >90)
GLUCOSE: 118 mg/dL — AB (ref 70–99)
Potassium: 4 mEq/L (ref 3.7–5.3)
Sodium: 138 mEq/L (ref 137–147)

## 2014-10-10 LAB — LEGIONELLA ANTIGEN, URINE

## 2014-10-10 LAB — HIV ANTIBODY (ROUTINE TESTING W REFLEX): HIV: NONREACTIVE

## 2014-10-10 MED ORDER — SODIUM CHLORIDE 0.9 % IV SOLN
INTRAVENOUS | Status: AC
  Administered 2014-10-10: 02:00:00 via INTRAVENOUS

## 2014-10-10 MED ORDER — INFLUENZA VAC SPLIT QUAD 0.5 ML IM SUSY
0.5000 mL | PREFILLED_SYRINGE | INTRAMUSCULAR | Status: DC
  Filled 2014-10-10 (×2): qty 0.5

## 2014-10-10 MED ORDER — IPRATROPIUM-ALBUTEROL 0.5-2.5 (3) MG/3ML IN SOLN: 3.0000 mL | RESPIRATORY_TRACT | Status: DC | PRN

## 2014-10-10 MED ORDER — GUAIFENESIN 100 MG/5ML PO SYRP
200.0000 mg | ORAL_SOLUTION | ORAL | Status: DC | PRN
  Filled 2014-10-10: qty 10

## 2014-10-10 MED ORDER — DEXTROSE 5 % IV SOLN
1.0000 g | INTRAVENOUS | Status: DC
  Administered 2014-10-10 – 2014-10-12 (×3): 1 g via INTRAVENOUS
  Filled 2014-10-10 (×4): qty 10

## 2014-10-10 MED ORDER — SODIUM CHLORIDE 0.9 % IV SOLN
INTRAVENOUS | Status: DC
  Administered 2014-10-11: 22:00:00 via INTRAVENOUS

## 2014-10-10 MED ORDER — ACETAMINOPHEN 325 MG PO TABS
650.0000 mg | ORAL_TABLET | ORAL | Status: DC | PRN
  Administered 2014-10-10 – 2014-10-12 (×4): 650 mg via ORAL
  Filled 2014-10-10 (×4): qty 2

## 2014-10-10 MED ORDER — DEXTROSE 5 % IV SOLN
500.0000 mg | INTRAVENOUS | Status: DC
  Administered 2014-10-10 – 2014-10-12 (×3): 500 mg via INTRAVENOUS
  Filled 2014-10-10 (×3): qty 500

## 2014-10-10 MED ORDER — ENOXAPARIN SODIUM 40 MG/0.4ML ~~LOC~~ SOLN
40.0000 mg | SUBCUTANEOUS | Status: DC
  Administered 2014-10-10 – 2014-10-13 (×4): 40 mg via SUBCUTANEOUS
  Filled 2014-10-10 (×4): qty 0.4

## 2014-10-10 MED ORDER — ADULT MULTIVITAMIN LIQUID CH
5.0000 mL | Freq: Every day | ORAL | Status: DC
  Administered 2014-10-10 – 2014-10-13 (×4): 5 mL via ORAL
  Filled 2014-10-10 (×4): qty 5

## 2014-10-10 MED ORDER — ENSURE COMPLETE PO LIQD
237.0000 mL | Freq: Three times a day (TID) | ORAL | Status: DC
  Administered 2014-10-10 – 2014-10-13 (×10): 237 mL via ORAL

## 2014-10-10 MED ORDER — INFLUENZA VAC SPLIT QUAD 0.5 ML IM SUSY: 0.5000 mL | PREFILLED_SYRINGE | INTRAMUSCULAR | Status: DC

## 2014-10-10 MED ORDER — INFLUENZA VAC SPLIT QUAD 0.5 ML IM SUSY
0.5000 mL | PREFILLED_SYRINGE | INTRAMUSCULAR | Status: AC
  Administered 2014-10-11: 0.5 mL via INTRAMUSCULAR
  Filled 2014-10-10 (×2): qty 0.5

## 2014-10-10 NOTE — Progress Notes (Signed)
Patient ambulated to the restroom without oxygen (had an episode of diarrhea reported as first episode-prior BM normal) and became very SOB upon returning to bed.  Oxygen saturation was checked which was 88-89% on RA, respirations 35.  Oxygen applied, at 2 L/M [place] and oxygen saturation improved to 95%.  Patient felt much better and respirations slowly improving.  Will continue to monitor.

## 2014-10-10 NOTE — Progress Notes (Signed)
INITIAL NUTRITION ASSESSMENT  DOCUMENTATION CODES Per approved criteria  -Severe malnutrition in the context of chronic illness -Underweight  Pt meets criteria for severe MALNUTRITION in the context of chronic illness as evidenced by 24% body weight loss in two months, severe muscle wasting and subcutaneous fat loss in multiple body regions, and likely PO intake < 75% for > one month.  INTERVENTION: -Recommend Ensure Complete po TID, each supplement provides 350 kcal and 13 grams of protein -Recommend multivitamin -Consider liberalizing diet to encourage PO intake -Diet textures per SLP -RD to follow up for d/c needs- may benefit from additional education for weight management once more alert and/or supplement coupons -RD to continue to monitor  NUTRITION DIAGNOSIS: Unintentional wt loss related to inadequate energy intake as evidenced by 40 lb weight loss in two months, severe muscle wasting and fat loss.   Goal: Pt to meet >/= 90% of their estimated nutrition needs    Monitor:  Total protein/energy intake, labs, weights, diet order, d/c needs  Reason for Assessment: MST/Consult to Assess/Underweight  63 y.o. male  Admitting Dx: CAP (community acquired pneumonia)  ASSESSMENT: 63 yo male with 2 weeks of coughing and not feeling well. Just feels "awful". No fevers or chills. No n/v/d. Some chest pain with coughing no le edeam or swelling. Feels better after receiving some abx and ivf in the ED.   -Pt endorsed unintentional wt loss of 40 lbs in past two months, usual body weight around 165 lb -Denied significant change in appetite. Pt reported to consume 3 meals daily, mostly from fast food places. Will eat 2-3 burgers per day. Would benefit from addition of multivitamin as pt likely deficient as diet lacks fruits, vegetables, whole grains etc Has consumed Ensure occasionally. Does not cook for self at home.  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 May need coupons upon d/c -Pt with severe wasting in upper arm, clavicle, acromion patellar region, thigh, calf regions.    Height: Ht Readings from Last 1 Encounters:  10/10/14 6\' 2"  (1.88 m)    Weight: Wt Readings from Last 1 Encounters:  10/10/14 123 lb 9.6 oz (56.065 kg)    Ideal Body Weight: 190 lb  % Ideal Body Weight: 65%   Wt Readings from Last 10 Encounters:  10/10/14 123 lb 9.6 oz (56.065 kg)    Usual Body Weight: 165 lb  % Usual Body Weight: 75%  BMI:  Body mass index is 15.86 kg/(m^2). Underweight  Estimated Nutritional Needs: Kcal: 1700-1900 Protein: 85-100 gram Fluid: >/= 1700 ml daily  Skin: WDL  Diet Order: Cardiac  EDUCATION NEEDS: -Education not appropriate at this time   Intake/Output Summary (Last 24 hours) at 10/10/14 1605 Last data filed at 10/10/14 1328  Gross per 24 hour  Intake 893.75 ml  Output    670 ml  Net 223.75 ml    Last BM: PTA   Labs:   Recent Labs Lab 10/09/14 2118 10/10/14 0500  NA 138 138  K 4.1 4.0  CL 99 101  CO2 25 25  BUN 11 11  CREATININE 0.72 0.70  CALCIUM 9.2 8.7  GLUCOSE 125* 118*    CBG (last 3)  No results found for this basename: GLUCAP,  in the last 72 hours  Scheduled Meds: . azithromycin  500 mg Intravenous Q24H  .  cefTRIAXone (ROCEPHIN)  IV  1 g Intravenous Q24H  . enoxaparin (LOVENOX) injection  40 mg Subcutaneous Q24H  . feeding supplement (ENSURE COMPLETE)  237 mL Oral TID BM  . [START ON 10/11/2014] Influenza vac split quadrivalent PF  0.5 mL Intramuscular Tomorrow-1000    Continuous Infusions:   History reviewed. No pertinent past medical history.  History reviewed. No pertinent past surgical history.  Lloyd HugerSarah F Roshan Roback MS RD LDN Clinical Dietitian [phone]

## 2014-10-10 NOTE — Care Management Note (Addendum)
    Page 1 of 2   10/13/2014     3:12:08 PM CARE MANAGEMENT NOTE 10/13/2014  Patient:  Devon Wall,Devon Wall   Account Number:  [id number]  Date Initiated:  10/10/2014  Documentation initiated by:  Lanier ClamMAHABIR,Robby Pirani  Subjective/Objective Assessment:   63 Y/O M ADMITTED W/PNA.     Action/Plan:   FROM HOME.   Anticipated DC Date:  10/13/2014   Anticipated DC Plan:  HOME W HOME HEALTH SERVICES      DC Planning Services  CM consult      Choice offered to / List presented to:  C-1 Patient        HH arranged  HH-1 RN  HH-2 PT  HH-3 OT  HH-4 NURSE'S AIDE      HH agency  [facility]   Status of service:  Completed, signed off Medicare Important Message given?   (If response is "NO", the following Medicare IM given date fields will be blank) Date Medicare IM given:   Medicare IM given by:   Date Additional Medicare IM given:   Additional Medicare IM given by:    Discharge Disposition:  HOME W HOME HEALTH SERVICES  Per UR Regulation:  Reviewed for med. necessity/level of care/duration of stay  If discussed at Long Length of Stay Meetings, dates discussed:    Comments:  10/13/14 Mekenzie Modeste RN,BSN NCM [phone] [facility] CHOSEN BY PATIENT.TC KRISTEN [facility] REP AWARE OF D/C & HHC ORDERS.CONFIRMED PATIENT'S ADDRESS:[address] REP NOTIFIED.NO FURTHER D/C NEEDS.  10/10/14 Jamy Whyte RN,BSN NCM [phone] [facility].NOTED 02 SATS. WILL MONITOR FOR THE NEED OF HOME 02.PT ORDERED-AWAIT RECOMMENDATIONS.

## 2014-10-10 NOTE — Evaluation (Signed)
Clinical/Bedside Swallow Evaluation Patient Details  Name: Devon Wall MRN: [id number] Date of Birth: 06/26/1951  Today's Date: 10/10/2014 Time: 1100-1126 SLP Time Calculation (min): 26 min  Past Medical History: History reviewed. No pertinent past medical history. Past Surgical History: History reviewed. No pertinent past surgical history. HPI:  63 yo male [facility] with SOB - diagnosed with pna, malnutrition- h/o COPD per imaging studies.  Per CXR pt with left airspace disease - ? aspiration - has underlying chronic obstructive disease.  MD ordered BSE.    Assessment / Plan / Recommendation Clinical Impression  Pt with negative CN exam and presents with functional oropharyngeal swallow assessed clinically.  Swallow was timely with clear voice throughout and no indications of residuals.  Given pt with h/o chronic obstructive pulmonary disease, SLP provided him with compensation strategies and reviewed importance of taking rest breaks if dyspneic.  Using teach back, SLP reinforced strategies.      Poor dentition (appears decayed) noted and pt reports he needs to go to an oral surgeon to have them removed.      Aspiration Risk  Mild    Diet Recommendation Regular;Thin liquid   Liquid Administration via: Cup Medication Administration:  (pt states he "chews" his pills, defer to pt) Supervision: Patient able to self feed Compensations: Slow rate;Small sips/bites (rest break if dyspenic) Postural Changes and/or Swallow Maneuvers: Seated upright 90 degrees;Upright 30-60 min after meal    Other  Recommendations Oral Care Recommendations: Oral care BID   Follow Up Recommendations  None    Frequency and Duration     n/a   Pertinent Vitals/Pain Afebrile, decreased     Swallow Study Prior Functional Status   pt denies dysphagia but states he "can't taste anything"    General Date of Onset: 10/10/14 HPI: 63 [facility] - diagnosed with pna, malnutrition- h/o COPD  per imaging studies.  Per CXR pt with left airspace disease - ? aspiration - has underlying chronic obstructive disease.  MD ordered BSE.  Type of Study: Bedside swallow evaluation Diet Prior to this Study: Regular;Thin liquids Temperature Spikes Noted: No Respiratory Status: Room air History of Recent Intubation: No Behavior/Cognition: Alert;Cooperative;Pleasant mood Oral Cavity - Dentition: Adequate natural dentition (few posterior dentition that appear decayed) Self-Feeding Abilities: Able to feed self Patient Positioning: Upright in chair Baseline Vocal Quality: Clear Volitional Cough: Other (Comment) (did not test secondary to pt's rib pain) Volitional Swallow: Able to elicit    Oral/Motor/Sensory Function Overall Oral Motor/Sensory Function: Appears within functional limits for tasks assessed   Ice Chips Ice chips: Not tested   Thin Liquid Thin Liquid: Within functional limits Presentation: Cup;Self Fed;Straw    Nectar Thick Nectar Thick Liquid: Not tested   Honey Thick Honey Thick Liquid: Not tested   Puree Puree: Within functional limits Presentation: Spoon;Self Fed   Solid   GO    Solid: Within functional limits       Devon Burnetamara Katelyn Kohlmeyer, MS [facility] SLP [phone]

## 2014-10-10 NOTE — Progress Notes (Signed)
[facility] PROGRESS NOTE  Devon Wall ZOX:[id number] DOB: 12/19/1951 DOA: 10/09/2014 PCP: None  Brief narrative 63 year old male with no significant past medical history on file presented to the ED with about 2 weeks of dry cough and generalized weakness with shortness of breath. For past 2 weeks he was having left-sided back pain associated with cough and poor appetite. He also reports losing some weight over past 2 months. He denies any sick contacts or recent travel. In the ED patient was septic with fever of 100.44F , tachycardic and tachypnic with significant leukocytosis and lactate of 2.2. chest x-ray showing left lower lobe infiltrate with concern for aspiration pneumonia. Patient admitted to telemetry for sepsis with pneumonia.  Assessment/Plan: Sepsis secondary to community-acquired pneumonia Monitor on telemetry. Patient reportedly desatted to 80s on admission. He is quite sleepy on my exam this morning and also complains of left sided rib pain which is tender on pressure. Continue diabetic Rocephin and azithromycin. Urine for strep antigen positive. Follow Legionella antigen, blood cultures, HIV antibody, respiratory viral panel and influenza antigen. Chest x-ray suggest COPD changes. Patient reports quitting smoking for several years -Patient has poor dentition with high risk for aspiration. I will get a swallow eval. He denies alcohol use , walking or illicit drug use. Check urine for drug screen. -Supportive care with oxygen as needed, IV fluids, antitussives and Tylenol. DuoNeb every 4 hours when necessary.  Malnutrition Patient appears severely malnourished. We will obtain nutrition consult.  Left sided chest pain. Appears musculoskeletal with tenderness over her left lateral rib. No Signs of fracture noted on x-ray. We'll monitor clinically.  Diet: Regular, swallow evaluation  DVT prophylaxis: Subcutaneous Lovenox  Code Status: Full code Family Communication: None  at bedside. Reports not having any family and lives with a roommate Disposition Plan: Home once clinically improved   Consultants:  None  Procedures:  None  Antibiotics:  IV Rocephin and azithromycin since 10/22  HPI/Subjective: Patient seen and examined this morning. He was quite sleepy on my evaluation but arousable and answering questions. He reports being in the left lateral rib but denies further shortness of breath.  Objective: Filed Vitals:   10/10/14 0735  BP:   Pulse:   Temp: 98.5 F (36.9 C)  Resp:     Intake/Output Summary (Last 24 hours) at 10/10/14 1025 Last data filed at 10/10/14 0735  Gross per 24 hour  Intake 653.75 ml  Output    270 ml  Net 383.75 ml   Filed Weights   10/10/14 0451  Weight: 56.065 kg (123 lb 9.6 oz)    Exam:   General:  Elderly disheveled thin built male lying in bed sleepy but arousable  HEENT: No pallor, dry oral mucosa, poor dentition  Chest: Tender to pressure over her left lateral rib ( 5th-6th), diminished breath sounds over left lung base, no rhonchi no wheeze  Abdomen: Soft, nontender, nondistended, bowel sounds present  Extremities: Warm, no edema  CNS: Sleepy but arousable, oriented    Data Reviewed: Basic Metabolic Panel:  Recent Labs Lab 10/09/14 2118 10/10/14 0500  NA 138 138  K 4.1 4.0  CL 99 101  CO2 25 25  GLUCOSE 125* 118*  BUN 11 11  CREATININE 0.72 0.70  CALCIUM 9.2 8.7   Liver Function Tests:  Recent Labs Lab 10/09/14 2118  AST 23  ALT 18  ALKPHOS 84  BILITOT 0.6  PROT 8.3  ALBUMIN 3.2*   No results found for this basename: LIPASE, AMYLASE,  in  the last 168 hours No results found for this basename: AMMONIA,  in the last 168 hours CBC:  Recent Labs Lab 10/09/14 2118 10/10/14 0500  WBC 23.8* 22.6*  NEUTROABS 20.4* 18.1*  HGB 13.8 12.5*  HCT 39.8 36.9*  MCV 93.6 93.2  PLT 259 266   Cardiac Enzymes:  Recent Labs Lab 10/09/14 2118  TROPONINI <0.30   BNP (last 3  results) No results found for this basename: PROBNP,  in the last 8760 hours CBG: No results found for this basename: GLUCAP,  in the last 168 hours  No results found for this or any previous visit (from the past 240 hour(s)).   Studies: Dg Chest 2 View  10/09/2014   CLINICAL DATA:  Cough with left-sided chest pain and fever. Initial encounter.  EXAM: CHEST  2 VIEW  COMPARISON:  06/27/2011.  FINDINGS: The heart size and mediastinal contours are stable. The lungs are hyperinflated. There is new patchy airspace disease at the left lung base suspicious for pneumonia, possibly aspiration. Scattered nodularity in the upper lobes is stable, likely related to prior granulomatous disease. No significant pleural effusion is seen. The osseous structures appear unchanged.  IMPRESSION: New patchy left lower lobe airspace disease worrisome for pneumonia, possibly on the basis of aspiration. 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   Electronically Signed   By: Roxy HorsemanBill  Veazey M.D.   On: 10/09/2014 19:49    Scheduled Meds: . azithromycin  500 mg Intravenous Q24H  . cefTRIAXone (ROCEPHIN)  IV  1 g Intravenous Q24H  . enoxaparin (LOVENOX) injection  40 mg Subcutaneous Q24H   Continuous Infusions: . sodium chloride 75 mL/hr at 10/10/14 0137      Time spent: 25 minutes    Devon Wall  [facility] Pager [phone]. If 7PM-7AM, please contact night-coverage at [email or web address], password [email or web address] 10/10/2014, 10:25 AM  LOS: 1 day

## 2014-10-11 DIAGNOSIS — J9601 Acute respiratory failure with hypoxia: Secondary | ICD-10-CM

## 2014-10-11 DIAGNOSIS — E43 Unspecified severe protein-calorie malnutrition: Secondary | ICD-10-CM | POA: Diagnosis present

## 2014-10-11 LAB — CBC
HCT: 37.3 % — ABNORMAL LOW (ref 39.0–52.0)
Hemoglobin: 12.7 g/dL — ABNORMAL LOW (ref 13.0–17.0)
MCH: 32.1 pg (ref 26.0–34.0)
MCHC: 34 g/dL (ref 30.0–36.0)
MCV: 94.2 fL (ref 78.0–100.0)
PLATELETS: 274 10*3/uL (ref 150–400)
RBC: 3.96 MIL/uL — AB (ref 4.22–5.81)
RDW: 13 % (ref 11.5–15.5)
WBC: 20.7 10*3/uL — AB (ref 4.0–10.5)

## 2014-10-11 LAB — RESPIRATORY VIRUS PANEL
Adenovirus: NOT DETECTED
INFLUENZA A: NOT DETECTED
Influenza A H1: NOT DETECTED
Influenza A H3: NOT DETECTED
Influenza B: NOT DETECTED
Metapneumovirus: NOT DETECTED
PARAINFLUENZA 2 A: NOT DETECTED
PARAINFLUENZA 3 A: NOT DETECTED
Parainfluenza 1: NOT DETECTED
RESPIRATORY SYNCYTIAL VIRUS B: NOT DETECTED
Respiratory Syncytial Virus A: NOT DETECTED
Rhinovirus: NOT DETECTED

## 2014-10-11 NOTE — ED Provider Notes (Signed)
Medical screening examination/treatment/procedure(s) were conducted as a shared visit with non-physician practitioner(s) or resident and myself. I personally evaluated the patient during the encounter and agree with the findings.  I have personally reviewed any xrays and/ or EKG's with the provider and I agree with interpretation.  Patient with gradually worsening cough and back pain, similar to previous pneumonia. Patient is general weakness. On exam patient has rales bilateral, mild tachycardia, no distress, mild dry mucous membranes. Plan for antibiotics.  EKG reviewed heart rate 91, sinus, normal QT, no acute ST elevation.  Community acquired pneumonia   Enid SkeensJoshua M Sianni Cloninger, MD 10/11/14 [phone]

## 2014-10-11 NOTE — Progress Notes (Signed)
Received report from Ene Ali RN and agree with assessment. 

## 2014-10-11 NOTE — Progress Notes (Signed)
Patient ID: Devon Wall, male   DOB: [birth year], 63 y.o.   MRN: [id number] [facility] PROGRESS NOTE  Ralph Brouwer ION:[id number] DOB: [birth year] DOA: 10/09/2014 PCP: Kevan Ny, MD  Brief narrative: 63 year old male with no significant past medical history who presented to [facility] ED 10/09/2014 with 2 weeks worsening shortness of breath, dry cough, weakness. Patient also reported back pain , poor oral intake and weight loss. In the ED patient was septic with fever of 100.40F, tachycardic and tachypnic with significant leukocytosis of 23.8 and lactate of 2.2. Chest x-ray showed left lower lobe infiltrate with concern for aspiration pneumonia.   Assessment/Plan:    Principal Problem: Acute respiratory failure with hypoxia / Community acquired pneumonia due to streptococcus pneumonia / Possible aspiration pneumonia  Respiratory status stable. CXR on admission showed new patchy left lower lobe airspace disease worrisome for pneumonia, possibly on the basis of aspiration. 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    Continue antibiotics, azithromycin and rocephin.  Respiratory viral panel negative. Strep pneumonia positive. Legionella negative. HIV non reactive.   Continue duoneb every 4 hours PRN shortness of breath or wheezing.   Oxygen support via [place] to keep O2 saturation above 90%. Active Problems: Sepsis secondary to streptococcus pneumonia / Leukocytosis  Sepsis criteria met with initial vitals of tachycardia, tachypnea, fever. Additionally, patient had leukocytosis of 2.8, lactic acid was 2.29. CXR showed left lower lobe pneumonia concerning for aspiration.   Patient eating ok, able to tolerate PO intake although he has poor PO intake.   Treat with azithromycin and rocephin.  WBC count trending down. Severe protein calorie amlnutrition  Pt severely malnourished. Nutrition consulted.   DVT Prophylaxis   Lovenox subQ ordered    Code Status: Full.  Family Communication:  plan of care discussed with the patient Disposition Plan: Home when stable.    IV Access:   Peripheral IV Procedures and diagnostic studies:   Dg Chest 2 View 10/09/2014   New patchy left lower lobe airspace disease worrisome for pneumonia, possibly on the basis of aspiration. 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     Medical Consultants:   None  Other Consultants:   None  Anti-Infectives:   Azithromycin 10/09/2014 --> Rocephin 10/09/2014 -->   Leisa Lenz, MD  [facility] Pager [phone]  If 7PM-7AM, please contact night-coverage [email or web address] Password [email or web address] 10/11/2014, 3:20 PM   LOS: 2 days    HPI/Subjective: No acute overnight events.  Objective: Filed Vitals:   10/10/14 2020 10/11/14 0525 10/11/14 0940 10/11/14 1439  BP: 120/67 135/74  124/86  Pulse: 106 73  86  Temp: 98.5 F (36.9 C) 98.6 F (37 C)  98.7 F (37.1 C)  TempSrc: Oral Oral  Oral  Resp: '22 20  22  ' Height:      Weight:      SpO2: 99% 100% 99% 100%    Intake/Output Summary (Last 24 hours) at 10/11/14 1520 Last data filed at 10/11/14 1300  Gross per 24 hour  Intake   1590 ml  Output   1950 ml  Net   -360 ml    Exam:   General:  Pt is alert, follows commands appropriately, not in acute distress  Cardiovascular: Regular rate and rhythm, S1/S2, no murmurs  Respiratory: congested, coarse breath sounds, no wheezing  Abdomen: Soft, non tender, non distended, bowel sounds present  Extremities: No edema, pulses DP and PT palpable bilaterally  Neuro: Grossly nonfocal  Data Reviewed:  Basic Metabolic Panel:  Recent Labs Lab 10/09/14 2118 10/10/14 0500  NA 138 138  K 4.1 4.0  CL 99 101  CO2 25 25  GLUCOSE 125* 118*  BUN 11 11  CREATININE 0.72 0.70  CALCIUM 9.2 8.7   Liver Function Tests:  Recent Labs Lab 10/09/14 2118  AST 23  ALT 18  ALKPHOS 84  BILITOT 0.6  PROT  8.3  ALBUMIN 3.2*   No results found for this basename: LIPASE, AMYLASE,  in the last 168 hours No results found for this basename: AMMONIA,  in the last 168 hours CBC:  Recent Labs Lab 10/09/14 2118 10/10/14 0500 10/11/14 0535  WBC 23.8* 22.6* 20.7*  NEUTROABS 20.4* 18.1*  --   HGB 13.8 12.5* 12.7*  HCT 39.8 36.9* 37.3*  MCV 93.6 93.2 94.2  PLT 259 266 274   Cardiac Enzymes:  Recent Labs Lab 10/09/14 2118  TROPONINI <0.30   BNP: No components found with this basename: POCBNP,  CBG: No results found for this basename: GLUCAP,  in the last 168 hours  Recent Results (from the past 240 hour(s))  RESPIRATORY VIRUS PANEL     Status: None   Collection Time    10/10/14  2:11 AM      Result Value Ref Range Status   Source - RVPAN NOSE   Final   Respiratory Syncytial Virus A NOT DETECTED   Final   Respiratory Syncytial Virus B NOT DETECTED   Final   Influenza A NOT DETECTED   Final   Influenza B NOT DETECTED   Final   Parainfluenza 1 NOT DETECTED   Final   Parainfluenza 2 NOT DETECTED   Final   Parainfluenza 3 NOT DETECTED   Final   Metapneumovirus NOT DETECTED   Final   Rhinovirus NOT DETECTED   Final   Adenovirus NOT DETECTED   Final   Influenza A H1 NOT DETECTED   Final   Influenza A H3 NOT DETECTED   Final     Scheduled Meds: . azithromycin  500 mg Intravenous Q24H  . cefTRIAXone (ROCEPHIN)  IV  1 g Intravenous Q24H  . enoxaparin (LOVENOX) injection  40 mg Subcutaneous Q24H  . feeding supplement (ENSURE COMPLETE)  237 mL Oral TID BM  . multivitamin  5 mL Oral Daily   Continuous Infusions: . sodium chloride 75 mL/hr at 10/10/14 1645

## 2014-10-11 NOTE — Evaluation (Signed)
Physical Therapy Evaluation Patient Details Name: Devon CohoJames Rud MRN: [id number] DOB: 02/14/1951 Today's Date: 10/11/2014   History of Present Illness  Pt admitted after 2 week history of cough and found to have CAP.  Clinical Impression  Pt admitted with CAP. Pt currently with functional limitations due to the deficits listed below (see PT Problem List).  Pt will benefit from skilled PT to increase their independence and safety with mobility to allow discharge to the venue listed below. Pt with visible SOB at evaluation with o2 99% on 2L/min.  Pt appears weak and ill feeling.  He agreed to sit EOB which required MIN A, but declined OOB activity.  Pt was educated on importance of OOB and upright positioning.  Pt did allow therapist to raise HOB.  Spoke with nurse tech, who reports he did walk with her to bathroom yesterday and he was shaky.  Will need continued assessment to determine if he needs any DME upon d/c.       Follow Up Recommendations Home health PT    Equipment Recommendations  Other (comment) (to be determined)    Recommendations for Other Services       Precautions / Restrictions Precautions Precautions: Fall      Mobility  Bed Mobility Overal bed mobility: Needs Assistance Bed Mobility: Supine to Sit;Sit to Supine     Supine to sit: Min assist     General bed mobility comments: A to get trunk upright on supine to sit and A for LE with sit >supine  Transfers                 General transfer comment: Pt declined OOB despited education provided on importance of movement and OOB activity. Pt with visible SOB with o2 on 2 L/min and o2 at 99%.  Ambulation/Gait                Stairs            Wheelchair Mobility    Modified Rankin (Stroke Patients Only)       Balance                                             Pertinent Vitals/Pain Pain Assessment: Faces Pain Score: 4  Pain Location: L rib cage with  movement Pain Intervention(s): Monitored during session;Limited activity within patient's tolerance o2 99% on 2 L/min with supine> sit    Home Living Family/patient expects to be discharged to:: Private residence Living Arrangements: Non-relatives/Friends Lexicographer(roomate) Available Help at Discharge: Family (niece) Type of Home: House Home Access: Stairs to enter Entrance Stairs-Rails: None Secretary/administratorntrance Stairs-Number of Steps: 8 Home Layout: One level Home Equipment: None      Prior Function Level of Independence: Independent               Hand Dominance        Extremity/Trunk Assessment   Upper Extremity Assessment: Generalized weakness           Lower Extremity Assessment: Generalized weakness      Cervical / Trunk Assessment: Normal  Communication   Communication: No difficulties  Cognition Arousal/Alertness: Lethargic Behavior During Therapy: WFL for tasks assessed/performed Overall Cognitive Status: Within Functional Limits for tasks assessed                      General Comments General comments (skin  integrity, edema, etc.): Pt declined standing or any upright activity today.    Exercises        Assessment/Plan    PT Assessment Patient needs continued PT services  PT Diagnosis Generalized weakness;Acute pain;Difficulty walking   PT Problem List Decreased strength;Decreased activity tolerance;Decreased balance;Decreased mobility;Cardiopulmonary status limiting activity  PT Treatment Interventions Gait training;Stair training;Therapeutic activities;Functional mobility training;Therapeutic exercise   PT Goals (Current goals can be found in the Care Plan section) Acute Rehab PT Goals Patient Stated Goal: Feel better PT Goal Formulation: With patient Time For Goal Achievement: 10/25/14 Potential to Achieve Goals: Good    Frequency Min 3X/week   Barriers to discharge        Co-evaluation               End of Session   Activity  Tolerance: Patient limited by fatigue;Patient limited by lethargy Patient left: in bed Nurse Communication: Mobility status         Time: [id number] PT Time Calculation (min): 16 min   Charges:   PT Evaluation $Initial PT Evaluation Tier I: 1 Procedure PT Treatments $Therapeutic Activity: 8-22 mins   PT G Codes:          Athziry Millican LUBECK 10/11/2014, 9:42 AM

## 2014-10-12 DIAGNOSIS — E43 Unspecified severe protein-calorie malnutrition: Secondary | ICD-10-CM

## 2014-10-12 DIAGNOSIS — A403 Sepsis due to Streptococcus pneumoniae: Secondary | ICD-10-CM

## 2014-10-12 LAB — EXPECTORATED SPUTUM ASSESSMENT W REFEX TO RESP CULTURE

## 2014-10-12 LAB — CBC
HCT: 38.1 % — ABNORMAL LOW (ref 39.0–52.0)
HEMOGLOBIN: 13 g/dL (ref 13.0–17.0)
MCH: 31.8 pg (ref 26.0–34.0)
MCHC: 34.1 g/dL (ref 30.0–36.0)
MCV: 93.2 fL (ref 78.0–100.0)
PLATELETS: 332 10*3/uL (ref 150–400)
RBC: 4.09 MIL/uL — AB (ref 4.22–5.81)
RDW: 12.6 % (ref 11.5–15.5)
WBC: 9.4 10*3/uL (ref 4.0–10.5)

## 2014-10-12 LAB — EXPECTORATED SPUTUM ASSESSMENT W GRAM STAIN, RFLX TO RESP C

## 2014-10-12 MED ORDER — AZITHROMYCIN 500 MG PO TABS
500.0000 mg | ORAL_TABLET | ORAL | Status: DC
  Administered 2014-10-13: 500 mg via ORAL
  Filled 2014-10-12 (×2): qty 1

## 2014-10-12 NOTE — Progress Notes (Signed)
Patient ID: Devon Wall, male   DOB: December 06, 1951, 63 y.o.   MRN: [id number] [facility] PROGRESS NOTE  Devon Wall [id number]:[id number] DOB: 02-Sep-1951 DOA: 10/09/2014 PCP: Kevan Ny, MD  Brief narrative: 63 year old male with no significant past medical history who presented to [facility] ED 10/09/2014 with 2 weeks worsening shortness of breath, dry cough, weakness. Patient also reported back pain , poor oral intake and weight loss.  In the ED patient was septic with fever of 100.56F, tachycardic and tachypnic with significant leukocytosis of 23.8 and lactate of 2.2. Chest x-ray showed left lower lobe infiltrate with concern for aspiration pneumonia.   Assessment/Plan:   Principal Problem:  Acute respiratory failure with hypoxia / Community acquired pneumonia due to streptococcus pneumonia / Possible aspiration pneumonia  Respiratory status stable. CXR on admission showed new patchy left lower lobe airspace disease worrisome for pneumonia, possibly on the basis of aspiration. 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  Continue antibiotics, azithromycin and rocephin. May convert to PO since patient able to tolerate PO intake.  Strep pneumonia positive. Respiratory viral panel negative. Legionella negative. HIV non reactive.  Continue same BD regimen: duoneb every 4 hours PRN shortness of breath or wheezing.  Oxygen support via [place] to keep O2 saturation above 90%. Active Problems:  Sepsis secondary to streptococcus pneumonia / Leukocytosis  Sepsis criteria met with initial vitals of tachycardia, tachypnea, fever. Additionally, patient had leukocytosis of 2.8, lactic acid was 2.29. CXR showed left lower lobe pneumonia concerning for aspiration.  Blood cultures to date show no growth.  Continue azithromycin and rocephin.  WBC count trending down. Severe protein calorie amlnutrition  Pt severely malnourished. Nutrition consulted.  DVT Prophylaxis   Lovenox subQ ordered    Code Status: Full.  Family Communication: plan of care discussed with the patient  Disposition Plan: Home when stable.    IV Access:   Peripheral IV Procedures and diagnostic studies:   Dg Chest 2 View 10/09/2014 New patchy left lower lobe airspace disease worrisome for pneumonia, possibly on the basis of aspiration. 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  Medical Consultants:   None  Other Consultants:   None  Anti-Infectives:   Azithromycin 10/09/2014 -->  Rocephin 10/09/2014 -->   Leisa Lenz, MD  [facility] Pager [phone]  If 7PM-7AM, please contact night-coverage [email or web address] Password [email or web address] 10/12/2014, 2:59 PM   LOS: 3 days    HPI/Subjective: No acute overnight events.  Objective: Filed Vitals:   10/11/14 1439 10/11/14 2015 10/12/14 0555 10/12/14 1407  BP: 124/86 123/77 115/73 130/87  Pulse: 86 82 71 89  Temp: 98.7 F (37.1 C) 98.3 F (36.8 C) 97.6 F (36.4 C) 98.1 F (36.7 C)  TempSrc: Oral Oral Oral Oral  Resp: _0 Height:      Weight:      SpO2: 100% 99% 100% 97%    Intake/Output Summary (Last 24 hours) at 10/12/14 1459 Last data filed at 10/12/14 1300  Gross per 24 hour  Intake 2643.75 ml  Output   1600 ml  Net 1043.75 ml    Exam:   General:  Pt is alert, follows commands appropriately, not in acute distress  Cardiovascular: Regular rate and rhythm, S1/S2, no murmurs  Respiratory: diminished breath sounds, coarse, no wheezing  Abdomen: Soft, non tender, non distended, bowel sounds present  Extremities: No edema, pulses DP and PT palpable bilaterally  Neuro: No focal neurologic deficits.  Data Reviewed: Basic Metabolic  Panel:  Recent Labs Lab 10/09/14 2118 10/10/14 0500  NA 138 138  K 4.1 4.0  CL 99 101  CO2 25 25  GLUCOSE 125* 118*  BUN 11 11  CREATININE 0.72 0.70  CALCIUM 9.2 8.7   Liver Function Tests:  Recent Labs Lab  10/09/14 2118  AST 23  ALT 18  ALKPHOS 84  BILITOT 0.6  PROT 8.3  ALBUMIN 3.2*   No results found for this basename: LIPASE, AMYLASE,  in the last 168 hours No results found for this basename: AMMONIA,  in the last 168 hours CBC:  Recent Labs Lab 10/09/14 2118 10/10/14 0500 10/11/14 0535  WBC 23.8* 22.6* 20.7*  NEUTROABS 20.4* 18.1*  --   HGB 13.8 12.5* 12.7*  HCT 39.8 36.9* 37.3*  MCV 93.6 93.2 94.2  PLT 259 266 274   Cardiac Enzymes:  Recent Labs Lab 10/09/14 2118  TROPONINI <0.30   BNP: No components found with this basename: POCBNP,  CBG: No results found for this basename: GLUCAP,  in the last 168 hours  Recent Results (from the past 240 hour(s))  CULTURE, BLOOD (ROUTINE X 2)     Status: None   Collection Time    10/09/14 11:13 PM      Result Value Ref Range Status   Specimen Description BLOOD RIGHT ANTECUBITAL   Final   Special Requests BOTTLES DRAWN AEROBIC AND ANAEROBIC 5CC EACH   Final   Culture  Setup Time     Final   Value: 10/10/2014 04:05     Performed at [facility]   Culture     Final   Value:        BLOOD CULTURE RECEIVED NO GROWTH TO DATE CULTURE WILL BE HELD FOR 5 DAYS BEFORE ISSUING A FINAL NEGATIVE REPORT     Performed at [facility]   Report Status PENDING   Incomplete  CULTURE, BLOOD (ROUTINE X 2)     Status: None   Collection Time    10/09/14 11:18 PM      Result Value Ref Range Status   Specimen Description BLOOD R HAND   Final   Special Requests BOTTLES DRAWN AEROBIC AND ANAEROBIC 3CC EACH   Final   Culture  Setup Time     Final   Value: 10/10/2014 04:05     Performed at [facility]   Culture     Final   Value:        BLOOD CULTURE RECEIVED NO GROWTH TO DATE CULTURE WILL BE HELD FOR 5 DAYS BEFORE ISSUING A FINAL NEGATIVE REPORT     Performed at [facility]   Report Status PENDING   Incomplete  RESPIRATORY VIRUS PANEL     Status: None   Collection Time    10/10/14  2:11 AM      Result Value  Ref Range Status   Source - RVPAN NOSE   Final   Respiratory Syncytial Virus A NOT DETECTED   Final   Respiratory Syncytial Virus B NOT DETECTED   Final   Influenza A NOT DETECTED   Final   Influenza B NOT DETECTED   Final   Parainfluenza 1 NOT DETECTED   Final   Parainfluenza 2 NOT DETECTED   Final   Parainfluenza 3 NOT DETECTED   Final   Metapneumovirus NOT DETECTED   Final   Rhinovirus NOT DETECTED   Final   Adenovirus NOT DETECTED   Final   Influenza A H1 NOT DETECTED  Final   Influenza A H3 NOT DETECTED   Final   Comment: (NOTE)           Normal Reference Range for each Analyte: NOT DETECTED     Testing performed using the Luminex xTAG Respiratory Viral Panel test     kit.     The analytical performance characteristics of this assay have been     determined by [facility].  The modifications have not been     cleared or approved by the FDA. This assay has been validated pursuant     to the CLIA regulations and is used for clinical purposes.     Performed at [place], EXPECTORATED SPUTUM-ASSESSMENT     Status: None   Collection Time    10/11/14 11:00 PM      Result Value Ref Range Status   Specimen Description SPUTUM   Final   Special Requests NONE   Final   Sputum evaluation     Final   Value: THIS SPECIMEN IS ACCEPTABLE. RESPIRATORY CULTURE REPORT TO FOLLOW.   Report Status 10/12/2014 FINAL   Final     Scheduled Meds: . azithromycin  500 mg Oral Q24H  . cefTRIAXone (ROCEPHIN)  IV  1 g Intravenous Q24H  . enoxaparin (LOVENOX) injection  40 mg Subcutaneous Q24H  . feeding supplement (ENSURE COMPLETE)  237 mL Oral TID BM  . multivitamin  5 mL Oral Daily   Continuous Infusions: . sodium chloride 50 mL/hr at 10/11/14 2151

## 2014-10-12 NOTE — Progress Notes (Signed)
This patient is receiving azithromycin via the IV route. Based on criteria approved by the Pharmacy and Therapeutics Committee, this medication is being converted to the equivalent oral dose form. These criteria include:   . The patient is eating (either orally or per tube) and/or has been taking other orally administered medications for at least 24 hours.  . This patient has no evidence of active gastrointestinal bleeding or impaired GI absorption (gastrectomy, short bowel, patient on TNA or NPO).   If you have questions about this conversion, please contact the pharmacy department.  Bernadene Personrew Pihu Basil, PharmD Pager: [phone] 10/12/2014, 11:56 AM

## 2014-10-13 LAB — CBC
HCT: 35.5 % — ABNORMAL LOW (ref 39.0–52.0)
Hemoglobin: 12 g/dL — ABNORMAL LOW (ref 13.0–17.0)
MCH: 31.7 pg (ref 26.0–34.0)
MCHC: 33.8 g/dL (ref 30.0–36.0)
MCV: 93.7 fL (ref 78.0–100.0)
Platelets: 312 10*3/uL (ref 150–400)
RBC: 3.79 MIL/uL — ABNORMAL LOW (ref 4.22–5.81)
RDW: 12.6 % (ref 11.5–15.5)
WBC: 11.5 10*3/uL — ABNORMAL HIGH (ref 4.0–10.5)

## 2014-10-13 MED ORDER — GUAIFENESIN 100 MG/5ML PO SYRP: 200.0000 mg | ORAL_SOLUTION | ORAL | Status: DC | PRN

## 2014-10-13 MED ORDER — ENSURE COMPLETE PO LIQD: 237.0000 mL | Freq: Three times a day (TID) | ORAL | Status: DC

## 2014-10-13 MED ORDER — ALBUTEROL SULFATE HFA 108 (90 BASE) MCG/ACT IN AERS: 2.0000 | INHALATION_SPRAY | Freq: Four times a day (QID) | RESPIRATORY_TRACT | Status: AC | PRN

## 2014-10-13 MED ORDER — LEVOFLOXACIN 750 MG PO TABS: 750.0000 mg | ORAL_TABLET | Freq: Every day | ORAL | Status: AC

## 2014-10-13 NOTE — Discharge Summary (Signed)
Physician Discharge Summary  Jaryd Drew SWF:[id number] DOB: 07/06/1951 DOA: 10/09/2014  PCP: Kevan Ny, MD  Admit date: 10/09/2014 Discharge date: 10/13/2014  Time spent: 35 minutes  Recommendations for Outpatient Follow-up:  1. Discharged home with home health PT 2. Patient should follow-up with his PCP in 1 week  Discharge Diagnoses:  Principal Problem:   CAP (community acquired pneumonia)  Active Problems:   Hypoxia   Sepsis   Protein-calorie malnutrition, severe   COPD   Discharge Condition: Fair  Diet recommendation: Regular    Filed Weights   10/10/14 0451  Weight: 56.065 kg (123 lb 9.6 oz)    History of present illness:  Please refer to admission H&P for details, but in brief, 63 year old male with no significant past medical history except for possible COPD who presented to [facility] ED 10/09/2014 with 2 weeks of worsening shortness of breath, dry cough and severe weakness. Patient also reported back pain , poor oral intake and weight loss.  In the ED patient was septic with fever of 100.82F, tachycardic and tachypnic with significant leukocytosis of 23.8 and lactate of 2.2. Chest x-ray showed left lower lobe infiltrate with concern for aspiration pneumonia.   Hospital Course:  Sepsis secondary to Streptococcus pneumonia/? Aspiration Patient hypoxic on admission and placed on empiric Rocephin and azithromycin. Blood cultures and sputum cultures negative. Urine for Streptococcus antigen positive. Urine Legionella antigen negative. HIV antigen was negative. Flu PCR and respiratory viral panel was negative. Patient given supportive care with IV fluids, nebs and oxygen as needed -He has markedly improved and is maintaining O2 sat on room air. -I will discharge him on 2 more days of oral Levaquin to complete a seven-day course of antibiotics. I will also add antitussive and albuterol inhaler as needed for shortness of breath and wheezing. -Patient is clinically stable  for discharge home and plans to follow-up with his PCP in a week's time.   Severe protein calorie malnutrition Patient seen by nutrition consult and added nutrition supplements  Generalized weakness  Patient seen by physical therapy and recommended home health PT. This will be arranged  Marijuana use -Patient's urine tox was positive for marijuana and has been instructed to quit.  Procedures:  None  Consultations:  None    Discharge Exam: Filed Vitals:   10/13/14 0552  BP: 122/72  Pulse: 70  Temp: 98 F (36.7 C)  Resp: 28    General: Elderly thin built male in no acute distress HEENT: No pallor, moist oral mucosa Chest: Clear to auscultation bilaterally, no sounds CVS: Normal S1 and S2, no murmurs  Abdomen: Soft, nontender, nondistended, bowel sounds present Extremities: Warm, no edema CNS: Alert and oriented    Discharge Instructions You were cared for by a hospitalist during your hospital stay. If you have any questions about your discharge medications or the care you received while you were in the hospital after you are discharged, you can call the unit and asked to speak with the hospitalist on call if the hospitalist that took care of you is not available. Once you are discharged, your primary care physician will handle any further medical issues. Please note that NO REFILLS for any discharge medications will be authorized once you are discharged, as it is imperative that you return to your primary care physician (or establish a relationship with a primary care physician if you do not have one) for your aftercare needs so that they can reassess your need for medications and monitor your lab values.  Current Discharge Medication List    START taking these medications   Details  albuterol (PROVENTIL HFA;VENTOLIN HFA) 108 (90 BASE) MCG/ACT inhaler Inhale 2 puffs into the lungs every 6 (six) hours as needed for wheezing or shortness of breath. Qty: 1 Inhaler,  Refills: 2    feeding supplement, ENSURE COMPLETE, (ENSURE COMPLETE) LIQD Take 237 mLs by mouth 3 (three) times daily between meals. Qty: 90 Bottle, Refills: 0    guaifenesin (ROBITUSSIN) 100 MG/5ML syrup Take 10 mLs (200 mg total) by mouth every 4 (four) hours as needed for cough or congestion. Qty: 120 mL, Refills: 0    Levofloxacin 750 mg tablet                         take 1 tablet (750 mg) daily for next 2 days                                                                     Qty:2 tablets, refills: 0    No Known Allergies Follow-up Information   Follow up with DEAN, ERIC, MD. Schedule an appointment as soon as possible for a visit in 1 week.   Specialty:  Internal Medicine   Contact information:   [facility] [place] [address] [phone]        The results of significant diagnostics from this hospitalization (including imaging, microbiology, ancillary and laboratory) are listed below for reference.    Significant Diagnostic Studies: Dg Chest 2 View  10/09/2014   CLINICAL DATA:  Cough with left-sided chest pain and fever. Initial encounter.  EXAM: CHEST  2 VIEW  COMPARISON:  06/27/2011.  FINDINGS: The heart size and mediastinal contours are stable. The lungs are hyperinflated. There is new patchy airspace disease at the left lung base suspicious for pneumonia, possibly aspiration. Scattered nodularity in the upper lobes is stable, likely related to prior granulomatous disease. No significant pleural effusion is seen. The osseous structures appear unchanged.  IMPRESSION: New patchy left lower lobe airspace disease worrisome for pneumonia, possibly on the basis of aspiration. 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   Electronically Signed   By: Camie Patience M.D.   On: 10/09/2014 19:49    Microbiology: Recent Results (from the past 240 hour(s))  CULTURE, BLOOD (ROUTINE X 2)      Status: None   Collection Time    10/09/14 11:13 PM      Result Value Ref Range Status   Specimen Description BLOOD RIGHT ANTECUBITAL   Final   Special Requests BOTTLES DRAWN AEROBIC AND ANAEROBIC 5CC EACH   Final   Culture  Setup Time     Final   Value: 10/10/2014 04:05     Performed at [facility]   Culture     Final   Value:        BLOOD CULTURE RECEIVED NO GROWTH TO DATE CULTURE WILL BE HELD FOR 5 DAYS BEFORE ISSUING A FINAL NEGATIVE REPORT     Performed at [facility]   Report Status PENDING   Incomplete  CULTURE, BLOOD (ROUTINE X 2)     Status: None   Collection Time  10/09/14 11:18 PM      Result Value Ref Range Status   Specimen Description BLOOD R HAND   Final   Special Requests BOTTLES DRAWN AEROBIC AND ANAEROBIC [facility] EACH   Final   Culture  Setup Time     Final   Value: 10/10/2014 04:05     Performed at [facility]   Culture     Final   Value:        BLOOD CULTURE RECEIVED NO GROWTH TO DATE CULTURE WILL BE HELD FOR 5 DAYS BEFORE ISSUING A FINAL NEGATIVE REPORT     Performed at [facility]   Report Status PENDING   Incomplete  RESPIRATORY VIRUS PANEL     Status: None   Collection Time    10/10/14  2:11 AM      Result Value Ref Range Status   Source - RVPAN NOSE   Final   Respiratory Syncytial Virus A NOT DETECTED   Final   Respiratory Syncytial Virus B NOT DETECTED   Final   Influenza A NOT DETECTED   Final   Influenza B NOT DETECTED   Final   Parainfluenza 1 NOT DETECTED   Final   Parainfluenza 2 NOT DETECTED   Final   Parainfluenza 3 NOT DETECTED   Final   Metapneumovirus NOT DETECTED   Final   Rhinovirus NOT DETECTED   Final   Adenovirus NOT DETECTED   Final   Influenza A H1 NOT DETECTED   Final   Influenza A H3 NOT DETECTED   Final   Comment: (NOTE)           Normal Reference Range for each Analyte: NOT DETECTED     Testing performed using the Luminex xTAG Respiratory Viral Panel test     kit.     The analytical  performance characteristics of this assay have been     determined by [facility].  The modifications have not been     cleared or approved by the FDA. This assay has been validated pursuant     to the CLIA regulations and is used for clinical purposes.     Performed at [place], EXPECTORATED SPUTUM-ASSESSMENT     Status: None   Collection Time    10/11/14 11:00 PM      Result Value Ref Range Status   Specimen Description SPUTUM   Final   Special Requests NONE   Final   Sputum evaluation     Final   Value: THIS SPECIMEN IS ACCEPTABLE. RESPIRATORY CULTURE REPORT TO FOLLOW.   Report Status 10/12/2014 FINAL   Final  CULTURE, RESPIRATORY (NON-EXPECTORATED)     Status: None   Collection Time    10/11/14 11:00 PM      Result Value Ref Range Status   Specimen Description SPUTUM   Final   Special Requests NONE   Final   Gram Stain PENDING   Incomplete   Culture     Final   Value: NORMAL OROPHARYNGEAL FLORA     Performed at [facility]   Report Status PENDING   Incomplete     Labs: Basic Metabolic Panel:  Recent Labs Lab 10/09/14 2118 10/10/14 0500  NA 138 138  K 4.1 4.0  CL 99 101  CO2 25 25  GLUCOSE 125* 118*  BUN 11 11  CREATININE 0.72 0.70  CALCIUM 9.2 8.7   Liver Function Tests:  Recent Labs Lab 10/09/14 2118  AST 23  ALT 18  ALKPHOS 84  BILITOT 0.6  PROT 8.3  ALBUMIN 3.2*   No results found for this basename: LIPASE, AMYLASE,  in the last 168 hours No results found for this basename: AMMONIA,  in the last 168 hours CBC:  Recent Labs Lab 10/09/14 2118 10/10/14 0500 10/11/14 0535 10/12/14 1515 10/13/14 0438  WBC 23.8* 22.6* 20.7* 9.4 11.5*  NEUTROABS 20.4* 18.1*  --   --   --   HGB 13.8 12.5* 12.7* 13.0 12.0*  HCT 39.8 36.9* 37.3* 38.1* 35.5*  MCV 93.6 93.2 94.2 93.2 93.7  PLT 259 266 274 332 312   Cardiac Enzymes:  Recent Labs Lab 10/09/14 2118  TROPONINI <0.30   BNP: BNP (last 3 results) No results  found for this basename: PROBNP,  in the last 8760 hours CBG: No results found for this basename: GLUCAP,  in the last 168 hours     Signed:  Louellen Molder  [facility] 10/13/2014, 1:39 PM

## 2014-10-13 NOTE — Progress Notes (Signed)
Pt ambulated in hall on room air and maintained sats at 97%. Julio SicksK. Caldonia Leap RN

## 2014-10-13 NOTE — Progress Notes (Signed)
Physical Therapy Treatment Patient Details Name: Devon Wall MRN: [id number] DOB: 04/20/1951 Today's Date: 10/13/2014    History of Present Illness Pt admitted after 2 week history of cough and found to have CAP.    PT Comments    **Pt agreed to get OOB to recliner but declined ambulation until after he eats breakfast. He was steady with pivot transfer to recliner, independent with supine to sit. Will return later for ambulation. *  Follow Up Recommendations  Home health PT     Equipment Recommendations  Other (comment) (to be determined)    Recommendations for Other Services       Precautions / Restrictions Precautions Precautions: Fall    Mobility  Bed Mobility Overal bed mobility: Modified Independent Bed Mobility: Supine to Sit     Supine to sit: Modified independent (Device/Increase time)     General bed mobility comments: used rail  Transfers Overall transfer level: Needs assistance Equipment used: None Transfers: Sit to/from [employer] CorporationStand;Stand Pivot Transfers Sit to Stand: Supervision Stand pivot transfers: Supervision       General transfer comment: pt used bedrail and armrest of recliner for UE support during pivot to recliner. No LOB. He deferred ambulation, stated he wants to eat first.   Ambulation/Gait                 Stairs            Wheelchair Mobility    Modified Rankin (Stroke Patients Only)       Balance Overall balance assessment: Needs assistance   Sitting balance-Leahy Scale: Good                              Cognition Arousal/Alertness: Awake/alert Behavior During Therapy: WFL for tasks assessed/performed Overall Cognitive Status: Within Functional Limits for tasks assessed                      Exercises      General Comments        Pertinent Vitals/Pain Pain Assessment: No/denies pain    Home Living                      Prior Function            PT Goals (current goals  can now be found in the care plan section) Acute Rehab PT Goals Patient Stated Goal: Feel better, to get stronger PT Goal Formulation: With patient Time For Goal Achievement: 10/25/14 Potential to Achieve Goals: Good Progress towards PT goals: Progressing toward goals    Frequency  Min 3X/week    PT Plan Current plan remains appropriate    Co-evaluation             End of Session   Activity Tolerance: Patient tolerated treatment well Patient left: in chair;with call bell/phone within reach     Time: 0818-0829 PT Time Calculation (min): 11 min  Charges:  $Therapeutic Activity: 8-22 mins                    G Codes:      Tamala SerUhlenberg, Devon Wall 10/13/2014, 8:35 AM [phone]

## 2014-10-13 NOTE — Discharge Instructions (Signed)
Pneumonia, Adult  Pneumonia is an infection of the lungs. It may be caused by a germ (virus or bacteria). Some types of pneumonia can spread easily from person to person. This can happen when you cough or sneeze.  HOME CARE   Only take medicine as told by your doctor.   Take your medicine (antibiotics) as told. Finish it even if you start to feel better.   Do not smoke.   You may use a vaporizer or humidifier in your room. This can help loosen thick spit (mucus).   Sleep so you are almost sitting up (semi-upright). This helps reduce coughing.   Rest.  A shot (vaccine) can help prevent pneumonia. Shots are often advised for:   People over 65 years old.   Patients on chemotherapy.   People with long-term (chronic) lung problems.   People with immune system problems.  GET HELP RIGHT AWAY IF:    You are getting worse.   You cannot control your cough, and you are losing sleep.   You cough up blood.   Your pain gets worse, even with medicine.   You have a fever.   Any of your problems are getting worse, not better.   You have shortness of breath or chest pain.  MAKE SURE YOU:    Understand these instructions.   Will watch your condition.   Will get help right away if you are not doing well or get worse.  Document Released: 05/23/2008 Document Revised: 02/27/2012 Document Reviewed: 02/25/2011  ExitCare Patient Information 2015 [employer]. This information is not intended to replace advice given to you by your health care provider. Make sure you discuss any questions you have with your health care provider.

## 2014-10-14 LAB — CULTURE, RESPIRATORY W GRAM STAIN: Culture: NORMAL

## 2014-10-14 LAB — CULTURE, RESPIRATORY

## 2014-10-16 LAB — CULTURE, BLOOD (ROUTINE X 2)
Culture: NO GROWTH
Culture: NO GROWTH

## 2015-11-10 ENCOUNTER — Emergency Department (HOSPITAL_COMMUNITY): Payer: No Typology Code available for payment source

## 2015-11-10 ENCOUNTER — Inpatient Hospital Stay (HOSPITAL_COMMUNITY)
Admission: EM | Admit: 2015-11-10 | Discharge: 2015-11-12 | DRG: 871 | Disposition: A | Discharge status: Home Health | Payer: No Typology Code available for payment source | Attending: Internal Medicine | Admitting: Internal Medicine

## 2015-11-10 ENCOUNTER — Encounter (HOSPITAL_COMMUNITY): Payer: Self-pay | Admitting: Emergency Medicine

## 2015-11-10 DIAGNOSIS — A419 Sepsis, unspecified organism: Secondary | ICD-10-CM | POA: Diagnosis present

## 2015-11-10 DIAGNOSIS — Z79899 Other long term (current) drug therapy: Secondary | ICD-10-CM

## 2015-11-10 DIAGNOSIS — J9601 Acute respiratory failure with hypoxia: Secondary | ICD-10-CM | POA: Diagnosis present

## 2015-11-10 DIAGNOSIS — D72829 Elevated white blood cell count, unspecified: Secondary | ICD-10-CM | POA: Diagnosis not present

## 2015-11-10 DIAGNOSIS — J441 Chronic obstructive pulmonary disease with (acute) exacerbation: Secondary | ICD-10-CM | POA: Diagnosis present

## 2015-11-10 DIAGNOSIS — J189 Pneumonia, unspecified organism: Secondary | ICD-10-CM

## 2015-11-10 DIAGNOSIS — Z681 Body mass index (BMI) 19 or less, adult: Secondary | ICD-10-CM

## 2015-11-10 DIAGNOSIS — E43 Unspecified severe protein-calorie malnutrition: Secondary | ICD-10-CM | POA: Diagnosis present

## 2015-11-10 DIAGNOSIS — A409 Streptococcal sepsis, unspecified: Principal | ICD-10-CM | POA: Diagnosis present

## 2015-11-10 DIAGNOSIS — R636 Underweight: Secondary | ICD-10-CM | POA: Diagnosis present

## 2015-11-10 DIAGNOSIS — J154 Pneumonia due to other streptococci: Secondary | ICD-10-CM | POA: Diagnosis present

## 2015-11-10 DIAGNOSIS — E440 Moderate protein-calorie malnutrition: Secondary | ICD-10-CM | POA: Insufficient documentation

## 2015-11-10 DIAGNOSIS — J181 Lobar pneumonia, unspecified organism: Secondary | ICD-10-CM | POA: Diagnosis present

## 2015-11-10 HISTORY — DX: Sepsis, unspecified organism: A41.9

## 2015-11-10 LAB — BASIC METABOLIC PANEL
Anion gap: 9 (ref 5–15)
BUN: 14 mg/dL (ref 6–20)
CALCIUM: 8.8 mg/dL — AB (ref 8.9–10.3)
CO2: 26 mmol/L (ref 22–32)
CREATININE: 0.74 mg/dL (ref 0.61–1.24)
Chloride: 99 mmol/L — ABNORMAL LOW (ref 101–111)
GFR calc non Af Amer: >60 mL/min (ref >60)
GLUCOSE: 120 mg/dL — AB (ref 65–99)
Potassium: 4.2 mmol/L (ref 3.5–5.1)
Sodium: 134 mmol/L — ABNORMAL LOW (ref 135–145)

## 2015-11-10 LAB — CBC WITH DIFFERENTIAL/PLATELET
Basophils Absolute: 0 10*3/uL (ref 0.0–0.1)
Basophils Relative: 0 %
EOS ABS: 0 10*3/uL (ref 0.0–0.7)
EOS PCT: 0 %
HCT: 35.9 % — ABNORMAL LOW (ref 39.0–52.0)
Hemoglobin: 12.3 g/dL — ABNORMAL LOW (ref 13.0–17.0)
LYMPHS ABS: 0.9 10*3/uL (ref 0.7–4.0)
LYMPHS PCT: 3 %
MCH: 32.8 pg (ref 26.0–34.0)
MCHC: 34.3 g/dL (ref 30.0–36.0)
MCV: 95.7 fL (ref 78.0–100.0)
MONO ABS: 0.9 10*3/uL (ref 0.1–1.0)
MONOS PCT: 3 %
Neutro Abs: 27.9 10*3/uL — ABNORMAL HIGH (ref 1.7–7.7)
Neutrophils Relative %: 94 %
PLATELETS: 284 10*3/uL (ref 150–400)
RBC: 3.75 MIL/uL — ABNORMAL LOW (ref 4.22–5.81)
RDW: 13.4 % (ref 11.5–15.5)
WBC: 29.8 10*3/uL — AB (ref 4.0–10.5)

## 2015-11-10 LAB — MRSA PCR SCREENING: MRSA by PCR: NEGATIVE

## 2015-11-10 LAB — I-STAT TROPONIN, ED: Troponin i, poc: 0.02 ng/mL (ref 0.00–0.08)

## 2015-11-10 LAB — LACTIC ACID, PLASMA: LACTIC ACID, VENOUS: 1.4 mmol/L (ref 0.5–2.0)

## 2015-11-10 LAB — CBC
HCT: 37.2 % — ABNORMAL LOW (ref 39.0–52.0)
Hemoglobin: 12.6 g/dL — ABNORMAL LOW (ref 13.0–17.0)
MCH: 32.6 pg (ref 26.0–34.0)
MCHC: 33.9 g/dL (ref 30.0–36.0)
MCV: 96.1 fL (ref 78.0–100.0)
PLATELETS: 296 10*3/uL (ref 150–400)
RBC: 3.87 MIL/uL — ABNORMAL LOW (ref 4.22–5.81)
RDW: 13.1 % (ref 11.5–15.5)
WBC: 31.1 10*3/uL — AB (ref 4.0–10.5)

## 2015-11-10 LAB — STREP PNEUMONIAE URINARY ANTIGEN: Strep Pneumo Urinary Antigen: POSITIVE — AB

## 2015-11-10 LAB — I-STAT CG4 LACTIC ACID, ED: Lactic Acid, Venous: 0.98 mmol/L (ref 0.5–2.0)

## 2015-11-10 MED ORDER — DEXTROSE 5 % IV SOLN
500.0000 mg | INTRAVENOUS | Status: DC
  Administered 2015-11-10 – 2015-11-11 (×2): 500 mg via INTRAVENOUS
  Filled 2015-11-10 (×2): qty 500

## 2015-11-10 MED ORDER — IPRATROPIUM-ALBUTEROL 0.5-2.5 (3) MG/3ML IN SOLN
3.0000 mL | RESPIRATORY_TRACT | Status: DC | PRN
  Administered 2015-11-10: 3 mL via RESPIRATORY_TRACT
  Filled 2015-11-10: qty 3

## 2015-11-10 MED ORDER — DEXTROSE 5 % IV SOLN
500.0000 mg | Freq: Once | INTRAVENOUS | Status: DC
  Filled 2015-11-10: qty 500

## 2015-11-10 MED ORDER — ENSURE ENLIVE PO LIQD
237.0000 mL | Freq: Three times a day (TID) | ORAL | Status: DC
  Administered 2015-11-10 – 2015-11-12 (×7): 237 mL via ORAL
  Filled 2015-11-10 (×2): qty 237

## 2015-11-10 MED ORDER — METHYLPREDNISOLONE SODIUM SUCC 125 MG IJ SOLR
60.0000 mg | Freq: Two times a day (BID) | INTRAMUSCULAR | Status: DC
  Administered 2015-11-10 – 2015-11-11 (×2): 60 mg via INTRAVENOUS
  Filled 2015-11-10 (×2): qty 2

## 2015-11-10 MED ORDER — DEXTROSE 5 % IV SOLN: 1.0000 g | INTRAVENOUS | Status: DC

## 2015-11-10 MED ORDER — DEXTROSE 5 % IV SOLN
1.0000 g | INTRAVENOUS | Status: DC
  Administered 2015-11-11: 1 g via INTRAVENOUS
  Filled 2015-11-10 (×2): qty 10

## 2015-11-10 MED ORDER — SODIUM CHLORIDE 0.9 % IV BOLUS (SEPSIS): 1000.0000 mL | Freq: Once | INTRAVENOUS | Status: DC

## 2015-11-10 MED ORDER — DEXTROSE 5 % IV SOLN
1.0000 g | Freq: Once | INTRAVENOUS | Status: AC
  Administered 2015-11-10: 1 g via INTRAVENOUS
  Filled 2015-11-10: qty 10

## 2015-11-10 MED ORDER — SODIUM CHLORIDE 0.9 % IV SOLN
INTRAVENOUS | Status: DC
  Administered 2015-11-10: 15:00:00 via INTRAVENOUS

## 2015-11-10 MED ORDER — MORPHINE SULFATE (PF) 4 MG/ML IV SOLN
4.0000 mg | Freq: Once | INTRAVENOUS | Status: AC
  Administered 2015-11-10: 4 mg via INTRAVENOUS
  Filled 2015-11-10: qty 1

## 2015-11-10 NOTE — ED Notes (Signed)
Ambulated pt in hallway and O2 stayed between %96-%97 on room air. Pt still short of breath while walking.

## 2015-11-10 NOTE — Progress Notes (Signed)
ED CM confirmed that [facility] not [facility]

## 2015-11-10 NOTE — ED Notes (Signed)
Delay in starting antibiotics due to IV stick.  Matt, RN at bedside using ultrasound guidance

## 2015-11-10 NOTE — ED Notes (Signed)
While abmulatig, patient's oxygen was 96-97%.

## 2015-11-10 NOTE — ED Notes (Signed)
Per pt, states cold symptoms, chest pain, rib cage pain for a couple of days-states coughed a lot last week

## 2015-11-10 NOTE — ED Notes (Signed)
MD at bedside. 

## 2015-11-10 NOTE — ED Provider Notes (Signed)
CSN: [id number]     Arrival date & time 11/10/15  1155 History   First MD Initiated Contact with Patient 11/10/15 1226     Chief Complaint  Patient presents with  . Chest Pain     (Consider location/radiation/quality/duration/timing/severity/associated sxs/prior Treatment) Patient is a 64 y.o. male presenting with chest pain.  Chest Pain Associated symptoms: cough   Associated symptoms: no fever     Patient is a 64 year old man with history of tobacco abuse and COPD presenting with chest pain. Pain started 2 days ago and is located on the left side of his chest. Patient first noticed the pain upon awakening 2 days ago. Pain is worse with movement and described as dull. Pain also worse with cough and deep inspiration. No radiation. No increased shortness of breath. Has tried taking OTC pain medications without significant relief. No trauma or injury to the area. No fevers. No nausea or vomiting. Patient has not noticed anything that makes the pain better. No history of DVT or PE.   History reviewed. No pertinent past medical history. History reviewed. No pertinent past surgical history. No family history on file. Social History  Substance Use Topics  . Smoking status: Light Tobacco Smoker    Types: Cigars  . Smokeless tobacco: Never Used  . Alcohol Use: None    Review of Systems  Constitutional: Negative for fever and chills.  HENT: Negative.   Eyes: Negative.   Respiratory: Positive for cough.   Cardiovascular: Positive for chest pain.  Gastrointestinal: Negative.   Endocrine: Negative.   Genitourinary: Negative.   Musculoskeletal: Negative.   Skin: Negative.   Allergic/Immunologic: Negative.   Neurological: Negative.   Hematological: Negative.   Psychiatric/Behavioral: Negative.     Allergies  Review of patient's allergies indicates no known allergies.  Home Medications   Prior to Admission medications   Medication Sig Start Date End Date Taking? Authorizing  Provider  albuterol (PROVENTIL HFA;VENTOLIN HFA) 108 (90 BASE) MCG/ACT inhaler Inhale 2 puffs into the lungs every 6 (six) hours as needed for wheezing or shortness of breath. 10/13/14  Yes Nishant Dhungel, MD  feeding supplement, ENSURE COMPLETE, (ENSURE COMPLETE) LIQD Take 237 mLs by mouth 3 (three) times daily between meals. 10/13/14  Yes Nishant Dhungel, MD  guaifenesin (ROBITUSSIN) 100 MG/5ML syrup Take 10 mLs (200 mg total) by mouth every 4 (four) hours as needed for cough or congestion. 10/13/14  Yes Nishant Dhungel, MD  PRESCRIPTION MEDICATION Take 1 tablet by mouth once. Pain killer   Yes Historical Provider, MD   BP 113/80 mmHg  Pulse 104  Temp(Src) 99 F (37.2 C) (Oral)  Resp 18  SpO2 100% Physical Exam  Constitutional: He is oriented to person, place, and time. He appears well-developed. He appears ill.  HENT:  Head: Normocephalic and atraumatic.  Eyes: EOM are normal. Pupils are equal, round, and reactive to light.  Neck: Normal range of motion. Neck supple.  Cardiovascular: Regular rhythm.  Tachycardia present.   No murmur heard. Pulmonary/Chest: Effort normal and breath sounds normal. No respiratory distress. He has no wheezes. He has no rales. He exhibits tenderness.    Abdominal: Soft. Bowel sounds are normal. He exhibits no distension. There is no tenderness.  Musculoskeletal: Normal range of motion. He exhibits no edema.  Neurological: He is alert and oriented to person, place, and time. A cranial nerve deficit is present.  Skin: Skin is warm and dry. No rash noted.  Psychiatric: He has a normal mood and affect.  His behavior is normal.  Nursing note and vitals reviewed.   ED Course  Procedures (including critical care time) Labs Review Labs Reviewed  CBC - Abnormal; Notable for the following:    WBC 31.1 (*)    RBC 3.87 (*)    Hemoglobin 12.6 (*)    HCT 37.2 (*)    All other components within normal limits  BASIC METABOLIC PANEL - Abnormal; Notable for the  following:    Sodium 134 (*)    Chloride 99 (*)    Glucose, Bld 120 (*)    Calcium 8.8 (*)    All other components within normal limits  CULTURE, BLOOD (ROUTINE X 2)  CULTURE, BLOOD (ROUTINE X 2)  I-STAT TROPOININ, ED  I-STAT CG4 LACTIC ACID, ED    Imaging Review Dg Chest 2 View  11/10/2015  CLINICAL DATA:  Increasing cough and shortness of Breath EXAM: CHEST - 2 VIEW COMPARISON:  10/08/2014 FINDINGS: Cardiac shadow is within normal limits. The lungs are again hyperinflated consistent with COPD. Chronic scarring is noted in the apices bilaterally. Chronic changes are also noted in the left lung base similar to that seen on the prior exam although some superimposed infiltrate is noted. On the lateral projection this is shown to be within the left lingula. No bony abnormality is seen. IMPRESSION: Chronic changes in the left base and apices. Superimposed acute lingular pneumonia. Followup PA and lateral chest X-ray is recommended in 3-4 weeks following trial of antibiotic therapy to ensure resolution and exclude underlying malignancy. Electronically Signed   By: Alcide CleverMark  Lukens M.D.   On: 11/10/2015 12:39   I have personally reviewed and evaluated these images and lab results as part of my medical decision-making.   EKG Interpretation None      MDM   Final diagnoses:  CAP (community acquired pneumonia)   Patient is a 64 year old man with history of COPD and tobacco abuse presenting with 2 days of left sided chest pain and cough, found to have community acquired pneumonia on CXR. Patient initially hypoxic to upper 80s on room air, tachycardic to low 100s, and tachypneic to the upper 20s. Sats improved with 2L [place]. WBC elevated to 31.1. Lactate and troponin within normal limits. Will start CAP coverage with azithromycin and rocephin. Hospitalist team called for admission. Will accept patient to telemetry.    Ardith Darkaleb M Parker, MD 11/10/15 1446  Azalia BilisKevin Campos, MD 11/10/15 [phone]

## 2015-11-10 NOTE — Progress Notes (Signed)
ED CM contacted [facility] pt is not active with [facility]  Last time pt seen by [facility] was in October- November 2015

## 2015-11-10 NOTE — H&P (Signed)
[facility] History and Physical  Devon Wall MRN:[id number] DOB: 07/25/1951 DOA: 11/10/2015  Referring physician: ER physician, resident : Dr. Parker Caleb  PCP: DEAN, ERIC, MD  Chief Complaint: shortness of breath   HPI:  64 year old male with past medical history of COPD who presented to [facility] ED with worsening shortness of breaht over past couple of days prior to this admission associated with non productive cough, subjective fevers. He has tried robitussin over the counter with some symptomatic improvement in cough. He reported shortness of breath at rest and with exertion.  Pt can recall if cough is worse during the day or night. No leg swelling. No audible wheezing. He also tried albuterol inhale with minimal symptomatic relief. He has used if couple of times at home prior to this admission. No chest pain, no palpitations. No abdominal pain, nausea or vomiting. No lightheadedness or loss of consciousness. No blood in stool or urine.  In ED, BP was 113/80, HR 104, RR 28, T max 100.7F and oxygen saturation was 88% on room air but has improved with [place] to 96%. Blood work showed WBC count of 31.1, hemoglobin 12.6. CXR showed lingular pneumonia. He was started on zosyn in ED but sepsis criteria met so we started vanco in addition to zosyn until culture results are pending. Due to respiratory distress, pt admitted to SDU for 24 hours observation.   Assessment & Plan    Principal Problem: Sepsis due to pneumonia (HCC) / Leukocytosis / CAP (community acquired pneumonia) - Sepsis criteria met on admission with fever tachycardia, tachypnea, hypoxia, leukocytosis. Source of infection suspected pneumonia considering CXR finding of lingular pneumonia. Pneumonia order set placed.  - Sepsis work up initiate. Lactic acid WNL. Procalcitonin discontinued apparently sample hemolyzed. Will repeat the level with next lactic acid check. - Started vanco and zosyn - Follow up blood culture results -  Follow up strep pneumonia, legionella and HIV results - Follow up respiratory culture results - Monitor in SDU due to current respiratory distress.  Active Problems: Acute respiratory failure with hypoxia (HCC) / COPD exacerbation (HCC) - Likely due to combination od COPD and pneumonia - Started solumedrol 60 mg IV Q 12 hours - Added duoneb every 4 hours as needed for shortness of breath or wheezing  - Continue oxygen support via [place] to keep O2 sats above 90%  Protein-calorie malnutrition, severe (HCC) / Underweight  - In the context of chronic illness - Nutrition consulted  - Body mass index is 16.55 kg/(m^2).   DVT prophylaxis:  - SCD's bilaterally   Radiological Exams on Admission: Dg Chest 2 View 11/10/2015 Chronic changes in the left base and apices. Superimposed acute lingular pneumonia. Followup PA and lateral chest X-ray is recommended in 3-4 weeks following trial of antibiotic therapy to ensure resolution and exclude underlying malignancy. Electronically Signed   By: Mark  Lukens M.D.   On: 11/10/2015 12:39     Code Status: Full Family Communication: Plan of care discussed with the patient  Disposition Plan: Admit for further evaluation, SDU due ot respiratory distress   DEVINE, ALMA, MD  [facility] Pager [phone]  Time spent in minutes: 75 minutes  Review of Systems:  Constitutional: Negative for fever, chills and malaise/fatigue. Negative for diaphoresis.  HENT: Negative for hearing loss, ear pain, nosebleeds, congestion, sore throat, neck pain, tinnitus and ear discharge.   Eyes: Negative for blurred vision, double vision, photophobia, pain, discharge and redness.  Respiratory: per HPI.   Cardiovascular: Negative for chest   pain, palpitations, orthopnea, claudication and leg swelling.  Gastrointestinal: Negative for nausea, vomiting and abdominal pain. Negative for heartburn, constipation, blood in stool and melena.  Genitourinary: Negative for dysuria,  urgency, frequency, hematuria and flank pain.  Musculoskeletal: Negative for myalgias, back pain, joint pain and falls.  Skin: Negative for itching and rash.  Neurological: Negative for dizziness and weakness. Negative for tingling, tremors, sensory change, speech change, focal weakness, loss of consciousness and headaches.  Endo/Heme/Allergies: Negative for environmental allergies and polydipsia. Does not bruise/bleed easily.  Psychiatric/Behavioral: Negative for suicidal ideas. The patient is not nervous/anxious.      History reviewed. No pertinent past medical history. History reviewed. No pertinent past surgical history. Social History:  reports that he has been smoking Cigars.  He has never used smokeless tobacco. His alcohol and drug histories are not on file.  No Known Allergies  Family History: Hypertension in mother    Prior to Admission medications   Medication Sig Start Date End Date Taking? Authorizing Provider  albuterol (PROVENTIL HFA;VENTOLIN HFA) 108 (90 BASE) MCG/ACT inhaler Inhale 2 puffs into the lungs every 6 (six) hours as needed for wheezing or shortness of breath. 10/13/14  Yes Nishant Dhungel, MD  feeding supplement, ENSURE COMPLETE, (ENSURE COMPLETE) LIQD Take 237 mLs by mouth 3 (three) times daily between meals. 10/13/14  Yes Nishant Dhungel, MD  guaifenesin (ROBITUSSIN) 100 MG/5ML syrup Take 10 mLs (200 mg total) by mouth every 4 (four) hours as needed for cough or congestion. 10/13/14  Yes Nishant Dhungel, MD  PRESCRIPTION MEDICATION Take 1 tablet by mouth once. Pain killer   Yes Historical Provider, MD   Physical Exam: Filed Vitals:   11/10/15 1721 11/10/15 1800 11/10/15 1900 11/10/15 2000  BP:  127/71 115/76   Pulse: 99 102 90   Temp:    99 F (37.2 C)  TempSrc:    Oral  Resp: 24 26 26   Height:      Weight:      SpO2: 88% 95% 96%     Physical Exam  Constitutional: Appears well-developed and well-nourished. No distress.  HENT: Normocephalic. No  tonsillar erythema or exudates Eyes: Conjunctivae are normal. No scleral icterus.  Neck: Normal ROM. Neck supple. No JVD. No tracheal deviation. No thyromegaly.  CVS: tachycardic, S1/S2 appreciated   Pulmonary: diminished with wheezing in upper lung lobes, no rhonchi   Abdominal: Soft. BS +,  no distension, tenderness, rebound or guarding.  Musculoskeletal: Normal range of motion. No edema and no tenderness.  Lymphadenopathy: No lymphadenopathy noted, cervical, inguinal. Neuro: Alert. Normal reflexes, muscle tone coordination. No focal neurologic deficits. Skin: Skin is warm and dry. No rash noted.  No erythema. No pallor.  Psychiatric: Normal mood and affect. Behavior, judgment, thought content normal.   Labs on Admission:  Basic Metabolic Panel:  Recent Labs Lab 11/10/15 1211  NA 134*  K 4.2  CL 99*  CO2 26  GLUCOSE 120*  BUN 14  CREATININE 0.74  CALCIUM 8.8*   Liver Function Tests: No results for input(s): AST, ALT, ALKPHOS, BILITOT, PROT, ALBUMIN in the last 168 hours. No results for input(s): LIPASE, AMYLASE in the last 168 hours. No results for input(s): AMMONIA in the last 168 hours. CBC:  Recent Labs Lab 11/10/15 1211 11/10/15 1730  WBC 31.1* 29.8*  NEUTROABS  --  27.9*  HGB 12.6* 12.3*  HCT 37.2* 35.9*  MCV 96.1 95.7  PLT 296 284   Cardiac Enzymes: No results for input(s): CKTOTAL, CKMB, CKMBINDEX, TROPONINI in   the last 168 hours. BNP: Invalid input(s): POCBNP CBG: No results for input(s): GLUCAP in the last 168 hours.  If 7PM-7AM, please contact night-coverage [email or web address] Password [email or web address] 11/10/2015, 9:37 PM       

## 2015-11-10 NOTE — ED Notes (Signed)
I ATTEMPTED TO COLLECT LABS AND WAS UNSUCCESSFUL.  I MADE THE NURSE AWARE. 

## 2015-11-11 DIAGNOSIS — J181 Lobar pneumonia, unspecified organism: Secondary | ICD-10-CM | POA: Diagnosis present

## 2015-11-11 LAB — CBC
HCT: 36.2 % — ABNORMAL LOW (ref 39.0–52.0)
HEMOGLOBIN: 12.7 g/dL — AB (ref 13.0–17.0)
MCH: 33.7 pg (ref 26.0–34.0)
MCHC: 35.1 g/dL (ref 30.0–36.0)
MCV: 96 fL (ref 78.0–100.0)
Platelets: 331 10*3/uL (ref 150–400)
RBC: 3.77 MIL/uL — AB (ref 4.22–5.81)
RDW: 13.1 % (ref 11.5–15.5)
WBC: 30.8 10*3/uL — ABNORMAL HIGH (ref 4.0–10.5)

## 2015-11-11 LAB — GLUCOSE, CAPILLARY: GLUCOSE-CAPILLARY: 213 mg/dL — AB (ref 65–99)

## 2015-11-11 LAB — BASIC METABOLIC PANEL
ANION GAP: 6 (ref 5–15)
BUN: 13 mg/dL (ref 6–20)
CALCIUM: 8.7 mg/dL — AB (ref 8.9–10.3)
CO2: 29 mmol/L (ref 22–32)
Chloride: 102 mmol/L (ref 101–111)
Creatinine, Ser: 0.65 mg/dL (ref 0.61–1.24)
GFR calc non Af Amer: >60 mL/min (ref >60)
Glucose, Bld: 129 mg/dL — ABNORMAL HIGH (ref 65–99)
POTASSIUM: 4.3 mmol/L (ref 3.5–5.1)
Sodium: 137 mmol/L (ref 135–145)

## 2015-11-11 LAB — LEGIONELLA PNEUMOPHILA SEROGP 1 UR AG: L. PNEUMOPHILA SEROGP 1 UR AG: NEGATIVE

## 2015-11-11 LAB — HIV ANTIBODY (ROUTINE TESTING W REFLEX): HIV Screen 4th Generation wRfx: NONREACTIVE

## 2015-11-11 MED ORDER — OXYCODONE-ACETAMINOPHEN 5-325 MG PO TABS
1.0000 | ORAL_TABLET | Freq: Four times a day (QID) | ORAL | Status: DC | PRN
  Administered 2015-11-11: 2 via ORAL
  Filled 2015-11-11: qty 2

## 2015-11-11 MED ORDER — PREDNISONE 50 MG PO TABS
50.0000 mg | ORAL_TABLET | Freq: Every day | ORAL | Status: DC
  Administered 2015-11-11: 50 mg via ORAL
  Filled 2015-11-11 (×2): qty 1

## 2015-11-11 NOTE — Evaluation (Signed)
Physical Therapy Evaluation Patient Details Name: Devon Wall MRN: [id number] DOB: May 27, 1951 Today's Date: 11/11/2015   History of Present Illness  64 yo male admitted 11/22 with increased SOB, hypoxia,Sepsis due to pneumonia / Leukocytosis / CAP (community acquired pneumonia), lobar pneumonia.  Clinical Impression  Pt admitted with above diagnosis. Pt currently with functional limitations due to the deficits listed below (see PT Problem List).  Pt will benefit from skilled PT to increase their independence and safety with mobility to allow discharge to the venue listed below.   Patient is a bit impulsive, noted dyspnea 4/4 with activity. sats 91% on 3 l, down to 85 on RA to walk 20'.     Follow Up Recommendations Home health PT;Supervision - Intermittent    Equipment Recommendations  None recommended by PT    Recommendations for Other Services       Precautions / Restrictions Precautions Precautions: Fall Precaution Comments: monitor sats Restrictions Weight Bearing Restrictions: No      Mobility  Bed Mobility Overal bed mobility: Modified Independent             General bed mobility comments: urgent need for Bathroom.  Transfers Overall transfer level: Needs assistance Equipment used: None Transfers: Sit to/from Stand Sit to Stand: Supervision            Ambulation/Gait Ambulation/Gait assistance: Supervision Ambulation Distance (Feet): 20 Feet (x2) Assistive device: None Gait Pattern/deviations: Step-through pattern;Decreased stride length     General Gait Details: cues for safety due to lines and tubes, patient on 3 l Oxygen, 91% after activity.   Stairs            Wheelchair Mobility    Modified Rankin (Stroke Patients Only)       Balance Overall balance assessment: Needs assistance         Standing balance support: During functional activity;No upper extremity supported Standing balance-Leahy Scale: Fair Standing balance  comment: stands and washes self up.                             Pertinent Vitals/Pain Pain Assessment: No/denies pain    Home Living Family/patient expects to be discharged to:: Private residence Living Arrangements: Alone Available Help at Discharge: Family Type of Home: House Home Access: Stairs to enter Entrance Stairs-Rails: None Secretary/administrator of Steps: 8 Home Layout: One level Home Equipment: None      Prior Function Level of Independence: Independent               Hand Dominance        Extremity/Trunk Assessment   Upper Extremity Assessment: Generalized weakness           Lower Extremity Assessment: Generalized weakness      Cervical / Trunk Assessment: Kyphotic  Communication   Communication: No difficulties  Cognition Arousal/Alertness: Awake/alert Behavior During Therapy: Impulsive Overall Cognitive Status: Within Functional Limits for tasks assessed                      General Comments      Exercises        Assessment/Plan    PT Assessment Patient needs continued PT services  PT Diagnosis Difficulty walking;Generalized weakness   PT Problem List Decreased strength;Decreased activity tolerance;Decreased mobility;Cardiopulmonary status limiting activity  PT Treatment Interventions Gait training;Functional mobility training;Therapeutic activities;Therapeutic exercise;Patient/family education   PT Goals (Current goals can be found in the Care Plan section) Acute Rehab  PT Goals Patient Stated Goal: agreed to walk when he can PT Goal Formulation: With patient Time For Goal Achievement: 11/25/15 Potential to Achieve Goals: Good    Frequency Min 3X/week   Barriers to discharge Decreased caregiver support      Co-evaluation               End of Session   Activity Tolerance: Patient limited by fatigue;Treatment limited secondary to medical complications (Comment) (DOE) Patient left: in chair;with  call bell/phone within reach;with chair alarm set Nurse Communication: Mobility status         Time: [phone] PT Time Calculation (min) (ACUTE ONLY): 22 min   Charges:   PT Evaluation $Initial PT Evaluation Tier I: 1 Procedure     PT G CodesRada Hay:        Zaeden Lastinger Elizabeth 11/11/2015, 10:53 AM Blanchard KelchKaren Cyriah Childrey PT [phone]

## 2015-11-11 NOTE — Progress Notes (Addendum)
Patient ID: Devon Wall, male   DOB: Feb 11, 1951, 64 y.o.   MRN: [id number] [facility] PROGRESS NOTE  Devon Wall TZG:[id number] DOB: October 19, 1951 DOA: 11/10/2015 PCP: Kevan Ny, MD  Brief narrative:    64 year old male with past medical history of COPD who presented to [facility] ED with worsening shortness of breaht over past couple of days prior to this admission associated with non productive cough, subjective fevers. Robitussin over the counter and his home albuterol has provided only slight symptomatic relief.  In ED, BP was 113/80, HR 104, RR 28, T max 100.46F and oxygen saturation was 88% on room air but has improved with [place] to 96%. Blood work showed WBC count of 31.1, hemoglobin 12.6. CXR showed lingular pneumonia. He was started on zosyn and vanco. Because of respiratory distress he was admitted to SDU for 24 hours observation.  Transfer to telemetry today 11/11/2015.  Assessment/Plan:    Principal Problem: Sepsis due to pneumonia ([place]) / Leukocytosis / CAP (community acquired pneumonia), lobar pneumonia, unspecified organism  - Sepsis criteria were met on admission with fever, tachycardia, tachypnea, hypoxia, leukocytosis. Procalcitonin discontinued apparently sample hemolyzed. Source of infection thought to be lingular pneumonia. CXR with findings of lingular pneumonia. - Admitted to SDU due to shortness of breath - Pneumonia and sepsis order set placed on admission - Vanco and zosyn started - Still with significant leukocytosis likely combination of pneumonia and steroids  - Blood culture, strep pneumonia, legionella and HIV results are all pending as off 11/23. - Respiratory culture result is pending as well - Stable to transfer to telemetry floor today  Active Problems: Acute respiratory failure with hypoxia ([place]) / COPD exacerbation ([place]) - Likely due to combination od COPD and pneumonia - Started solumedrol 60 mg IV Q 12 hours on admission; will change to prednisone today  50 mg daily  - Continue duoneb every 4 hours as needed for shortness of breath or wheezing  - Respiratory status stable  - Continue oxygen support via [place] to keep O2 sats above 90%  Protein-calorie malnutrition, severe (HCC) / Underweight  - In the context of chronic illness - Body mass index is 16.55 kg/(m^2). - Appreciate nutritionist consult and recommendations.    DVT prophylaxis:  - SCD's bilaterally in hospital   Code Status: Full.  Family Communication:  plan of care discussed with the patient Disposition Plan: transfer to telemetry today. Anticipate discharge 11/13/2015.  IV access:  Peripheral IV  Procedures and diagnostic studies:    Dg Chest 2 View 11/10/2015   Chronic changes in the left base and apices. Superimposed acute lingular pneumonia. Followup PA and lateral chest X-ray is recommended in 3-4 weeks following trial of antibiotic therapy to ensure resolution and exclude underlying malignancy. Electronically Signed   By: Inez Catalina M.D.   On: 11/10/2015 12:39   Medical Consultants:  None   Other Consultants:  PT Nutrition  IAnti-Infectives:   Vanco and zosyn 11/10/2015 -->   Leisa Lenz, MD  [facility] Pager [phone]  Time spent in minutes: 25 minutes  If 7PM-7AM, please contact night-coverage [email or web address] Password [email or web address] 11/11/2015, 9:02 AM   LOS: 1 day    HPI/Subjective: No acute overnight events. Patient reports feeling better.   Objective: Filed Vitals:   11/11/15 0600 11/11/15 0610 11/11/15 0700 11/11/15 0800  BP: 124/88  150/89 130/82  Pulse: 82  89 91  Temp:    97.5 F (36.4 C)  TempSrc:    Oral  Resp: 33  29  32  Height:      Weight:  57.4 kg (126 lb 8.7 oz)    SpO2: 96%  94% 94%    Intake/Output Summary (Last 24 hours) at 11/11/15 0902 Last data filed at 11/11/15 0700  Gross per 24 hour  Intake 806.33 ml  Output   1130 ml  Net -323.67 ml    Exam:   General:  Pt is alert, follows commands appropriately,  not in acute distress  Cardiovascular: Regular rate and rhythm, S1/S2 (+)  Respiratory: diminished breath sounds, no wheezing   Abdomen: Soft, non tender, non distended, bowel sounds present  Extremities: No edema, pulses DP and PT palpable bilaterally  Neuro: Grossly nonfocal  Data Reviewed: Basic Metabolic Panel:  Recent Labs Lab 11/10/15 1211 11/11/15 0325  NA 134* 137  K 4.2 4.3  CL 99* 102  CO2 26 29  GLUCOSE 120* 129*  BUN 14 13  CREATININE 0.74 0.65  CALCIUM 8.8* 8.7*   Liver Function Tests: No results for input(s): AST, ALT, ALKPHOS, BILITOT, PROT, ALBUMIN in the last 168 hours. No results for input(s): LIPASE, AMYLASE in the last 168 hours. No results for input(s): AMMONIA in the last 168 hours. CBC:  Recent Labs Lab 11/10/15 1211 11/10/15 1730 11/11/15 0325  WBC 31.1* 29.8* 30.8*  NEUTROABS  --  27.9*  --   HGB 12.6* 12.3* 12.7*  HCT 37.2* 35.9* 36.2*  MCV 96.1 95.7 96.0  PLT 296 284 331   Cardiac Enzymes: No results for input(s): CKTOTAL, CKMB, CKMBINDEX, TROPONINI in the last 168 hours. BNP: Invalid input(s): POCBNP CBG:  Recent Labs Lab 11/10/15 2034  GLUCAP 213*    Recent Results (from the past 240 hour(s))  MRSA PCR Screening     Status: None   Collection Time: 11/10/15  5:09 PM  Result Value Ref Range Status   MRSA by PCR NEGATIVE NEGATIVE Final     Scheduled Meds: . azithromycin  500 mg Intravenous Q24H  . cefTRIAXone (ROCEPHIN)  IV  1 g Intravenous Q24H  . feeding supplement (ENSURE ENLIVE)  237 mL Oral TID BM  . methylPREDNISolone (SOLU-MEDROL) injection  60 mg Intravenous Q12H   Continuous Infusions: . sodium chloride 10 mL/hr at 11/10/15 1800

## 2015-11-11 NOTE — Progress Notes (Signed)
Initial Nutrition Assessment  DOCUMENTATION CODES:   Non-severe (moderate) malnutrition in context of acute illness/injury  INTERVENTION:  Ensure Enlive po BID, each supplement provides 350 kcal and 20 grams of protein  NUTRITION DIAGNOSIS:   Inadequate oral intake related to poor appetite as evidenced by per patient/family report.  GOAL:   Patient will meet greater than or equal to 90% of their needs  MONITOR:   PO intake, I & O's, Labs, Supplement acceptance  REASON FOR ASSESSMENT:   Consult Assessment of nutrition requirement/status  ASSESSMENT:   64 year old male with past medical history of COPD who [facility] with worsening shortness of breaht over past couple of days prior to this admission associated with non productive cough, subjective fevers. He has tried robitussin over the counter with some symptomatic improvement in cough. He reported shortness of breath at rest and with exertion. Pt can recall if cough is worse during the day or night. No leg swelling. No audible wheezing. He also tried albuterol inhale with minimal symptomatic relief  Spoke with pt at bedside. Pt denies poor appetite, but stated he does not eat a lot. He did drink ensure at home at one point but hasn't as of recent. Denies weight loss, but states he hasn't gained any either.  Nutrition-Focused physical exam completed. Findings are moderate depletion, mild muscle depletion, and no edema.   Pt denies shortness of breath impact on nutrition.  Will provide ensure during stay.  Diet Order:  Diet Heart Room service appropriate?: Yes; Fluid consistency:: Thin  Skin:  Reviewed, no issues  Last BM:  11/07/2015  Height:   Ht Readings from Last 1 Encounters:  11/10/15 6\' 1"  (1.854 m)    Weight:   Wt Readings from Last 1 Encounters:  11/11/15 126 lb 8.7 oz (57.4 kg)    Ideal Body Weight:  83.63 kg  BMI:  Body mass index is 16.7 kg/(m^2).  Estimated Nutritional Needs:    Kcal:  2000-2200 calories  Protein:  55-70 grams  Fluid:  >/= 2L  EDUCATION NEEDS:   Education needs addressed  Dionne AnoWilliam M. Mckynzie Liwanag, MS, RD LDN After Hours/Weekend Pager [phone]

## 2015-11-11 NOTE — Care Management Note (Signed)
Case Management Note  Patient Details  Name: Devon CohoJames Ledwith MRN: [id number] Date of Birth: 06/07/1951  Subjective/Objective: 64 y/o m admitted w/PNA. From home. Spoke to patient about PT recc-HHPT. [facility]-we have contacted [facility], [facility]-non are active.Per patient request I contacted his [facility] [phone]-they did not know who patient's HHC agency was.[facility]-aware of referral, await HHPT,[id number] order.                   Action/Plan:d/c plan home w/HHC.   Expected Discharge Date:   (unknown)               Expected Discharge Plan:  Home w Home Health Services  In-House Referral:     Discharge planning Services  CM Consult  Post Acute Care Choice:    Choice offered to:  Patient  DME Arranged:    DME Agency:     HH Arranged:    HH Agency:     Status of Service:  In process, will continue to follow  Medicare Important Message Given:    Date Medicare IM Given:    Medicare IM give by:    Date Additional Medicare IM Given:    Additional Medicare Important Message give by:     If discussed at Long Length of Stay Meetings, dates discussed:    Additional Comments:  Lanier ClamMahabir, Orlen Leedy, RN 11/11/2015, 1:46 PM

## 2015-11-11 NOTE — Progress Notes (Signed)
Called report to Florentina AddisonKatie to go to room 1407.  Erick Blinksuchman, Skyley Grandmaison D, RN

## 2015-11-11 NOTE — Progress Notes (Signed)
Patient is c/o pain in ribs. PCP was notified. Awaiting new orders. Wll continue to monitor the patient..Marland Kitchen

## 2015-11-12 DIAGNOSIS — E440 Moderate protein-calorie malnutrition: Secondary | ICD-10-CM | POA: Insufficient documentation

## 2015-11-12 DIAGNOSIS — A403 Sepsis due to Streptococcus pneumoniae: Secondary | ICD-10-CM

## 2015-11-12 DIAGNOSIS — J13 Pneumonia due to Streptococcus pneumoniae: Secondary | ICD-10-CM

## 2015-11-12 MED ORDER — LEVOFLOXACIN 750 MG PO TABS: 750.0000 mg | ORAL_TABLET | Freq: Every day | ORAL | Status: DC

## 2015-11-12 MED ORDER — LEVOFLOXACIN 750 MG PO TABS
750.0000 mg | ORAL_TABLET | Freq: Once | ORAL | Status: AC
  Administered 2015-11-12: 750 mg via ORAL
  Filled 2015-11-12: qty 1

## 2015-11-12 MED ORDER — MENTHOL 3 MG MT LOZG
1.0000 | LOZENGE | OROMUCOSAL | Status: DC | PRN
  Filled 2015-11-12 (×2): qty 9

## 2015-11-12 NOTE — Progress Notes (Signed)
Patient is c/o sore throat. The PCP was notified. Awaiting new orders. Will continue to monitor the patient.

## 2015-11-12 NOTE — Progress Notes (Signed)
Devon NestleIfey, RN went over all discharge information with patient, prescriptions given.  Per case manager, Darl PikesSusan, [facility]  Pt wheeled out by NT.

## 2015-11-12 NOTE — Care Management (Addendum)
Case manager confirmed with [facility] Liaison Miranda that patient is setup for Home health nursing and therapies. Orderas and F2F have been entered by MD.

## 2015-11-12 NOTE — Discharge Summary (Addendum)
Physician Discharge Summary  Devon Wall TML:[id number] DOB: 1951-03-21 DOA: 11/10/2015  PCP: Kevan Ny, MD  Admit date: 11/10/2015 Discharge date: 11/12/2015  Recommendations for Outpatient Follow-up:  Please continue Levaquin for 7 days on discharge for treatment of pneumonia. Follow-up with primary care physician in one week to repeat the chest x-ray to make sure pneumonia is resolving.  Discharge Diagnoses:  Principal Problem:   Sepsis due to strep pneumonia [facility]) Active Problems:   Acute respiratory failure with hypoxia (HCC)   Protein-calorie malnutrition, severe (HCC)   Leukocytosis   COPD exacerbation (HCC)   Underweight   Lobar pneumonia, streptococcus (HCC)   Malnutrition of moderate degree    Discharge Condition: stable   Diet recommendation: as tolerated   History of present illness:  64 year old male with past medical history of COPD who presented to [facility] ED with worsening shortness of breaht over past couple of days prior to this admission associated with non productive cough, subjective fevers. Robitussin over the counter and his home albuterol has provided only slight symptomatic relief.  In ED, BP was 113/80, HR 104, RR 28, T max 100.37F and oxygen saturation was 88% on room air but has improved with [place] to 96%. Blood work showed WBC count of 31.1, hemoglobin 12.6. CXR showed lingular pneumonia. He was started on zosyn and vanco. Because of respiratory distress he was admitted to SDU for 24 hours observation.  Transfer to telemetry 11/11/2015.  Hospital Course:   Assessment/Plan:    Principal Problem: Sepsis due to streptococcus pneumonia ([place]) / Leukocytosis / CAP (community acquired pneumonia), lobar pneumonia due to streptococcus   - Sepsis criteria met on admission with fever, tachycardia, tachypnea, hypoxia, leukocytosis. Procalcitonin discontinued - sample hemolyzed. Source of infection lingular pneumonia as seen on admission CXR. Now we know he has  streptococcus pneumonia as strep pneumo antigen is positive.  - Admitted to SDU due to shortness of breath - Pneumonia and sepsis order set placed on admission - Patient was on vancomycin and Zosyn since the admission. Patient will continue Levaquin for 7 days on discharge. - His leukocytosis is likely reflective of steroids received since the admission. - Patient's respiratory status is stable, no wheezing or rhonchi. He does not require steroids on discharge. - Patient can have repeat CBC and chest x-ray in primary care office setting in about 1-2 weeks after discharge. - Blood culture, legionella and HIV - negative   Active Problems: Acute respiratory failure with hypoxia (HCC) / COPD exacerbation (HCC) - Likely due to combination od COPD and pneumonia - Started solumedrol 60 mg IV Q 12 hours on admission - Has received prednisone 50 mg 10/23 - Stable respiratory status and patient does not require steroids on discharge.  Protein-calorie malnutrition, severe (HCC) / Underweight  - In the context of chronic illness - Body mass index is 16.55 kg/(m^2). - Seen by nutritionist.    DVT prophylaxis:  - SCD's bilaterally    Code Status: Full.  Family Communication: plan of care discussed with the patient   IV access:  Peripheral IV  Procedures and diagnostic studies:   Dg Chest 2 View 11/10/2015 Chronic changes in the left base and apices. Superimposed acute lingular pneumonia. Followup PA and lateral chest X-ray is recommended in 3-4 weeks following trial of antibiotic therapy to ensure resolution and exclude underlying malignancy. Electronically Signed By: Inez Catalina M.D. On: 11/10/2015 12:39   Medical Consultants:  None   Other Consultants:  PT Nutrition  IAnti-Infectives:   Vanco  and zosyn 11/10/2015 --> 11/12/2015  Signed:  Leisa Lenz, MD  [facility] 11/12/2015, 9:22 AM  Pager #: [phone]  Time spent in minutes: less than  30 minutes    Discharge Exam: Filed Vitals:   11/11/15 2007 11/12/15 0525  BP: 126/74 120/78  Pulse: 97 98  Temp: 98.5 F (36.9 C) 98.1 F (36.7 C)  Resp: 20 20   Filed Vitals:   11/11/15 0957 11/11/15 1445 11/11/15 2007 11/12/15 0525  BP: 123/89 122/80 126/74 120/78  Pulse: 83 103 97 98  Temp: 97.4 F (36.3 C) 97.5 F (36.4 C) 98.5 F (36.9 C) 98.1 F (36.7 C)  TempSrc:  Oral Oral Oral  Resp: _0 Height:      Weight:      SpO2: 96% 95% 94% 96%    General: Pt is alert, follows commands appropriately, not in acute distress Cardiovascular: Regular rate and rhythm, S1/S2 +, no murmurs Respiratory: Clear to auscultation bilaterally, no wheezing, no crackles, no rhonchi Abdominal: Soft, non tender, non distended, bowel sounds +, no guarding Extremities: no edema, no cyanosis, pulses palpable bilaterally DP and PT Neuro: Grossly nonfocal  Discharge Instructions  Discharge Instructions    Call MD for:  difficulty breathing, headache or visual disturbances    Complete by:  As directed      Call MD for:  persistant dizziness or light-headedness    Complete by:  As directed      Call MD for:  persistant nausea and vomiting    Complete by:  As directed      Call MD for:  severe uncontrolled pain    Complete by:  As directed      Diet - low sodium heart healthy    Complete by:  As directed      Discharge instructions    Complete by:  As directed   Please continue Levaquin for 7 days on discharge for treatment of pneumonia. Follow-up with primary care physician in one week to repeat the chest x-ray to make sure pneumonia is resolving.     Increase activity slowly    Complete by:  As directed             Medication List    TAKE these medications        albuterol 108 (90 BASE) MCG/ACT inhaler  Commonly known as:  PROVENTIL HFA;VENTOLIN HFA  Inhale 2 puffs into the lungs every 6 (six) hours as needed for wheezing or shortness of breath.     feeding  supplement (ENSURE COMPLETE) Liqd  Take 237 mLs by mouth 3 (three) times daily between meals.     guaifenesin 100 MG/5ML syrup  Commonly known as:  ROBITUSSIN  Take 10 mLs (200 mg total) by mouth every 4 (four) hours as needed for cough or congestion.     levofloxacin 750 MG tablet  Commonly known as:  LEVAQUIN  Take 1 tablet (750 mg total) by mouth daily.     PRESCRIPTION MEDICATION  Take 1 tablet by mouth once. Pain killer           Follow-up Information    Follow up with Marlou Sa, ERIC, MD. Schedule an appointment as soon as possible for a visit in 1 week.   Specialty:  Internal Medicine   Why:  Follow up appt after recent hospitalization   Contact information:   [address] [place] [address] [phone]        The results of significant diagnostics  from this hospitalization (including imaging, microbiology, ancillary and laboratory) are listed below for reference.    Significant Diagnostic Studies: Dg Chest 2 View  11/10/2015  CLINICAL DATA:  Increasing cough and shortness of Breath EXAM: CHEST - 2 VIEW COMPARISON:  10/08/2014 FINDINGS: Cardiac shadow is within normal limits. The lungs are again hyperinflated consistent with COPD. Chronic scarring is noted in the apices bilaterally. Chronic changes are also noted in the left lung base similar to that seen on the prior exam although some superimposed infiltrate is noted. On the lateral projection this is shown to be within the left lingula. No bony abnormality is seen. IMPRESSION: Chronic changes in the left base and apices. Superimposed acute lingular pneumonia. Followup PA and lateral chest X-ray is recommended in 3-4 weeks following trial of antibiotic therapy to ensure resolution and exclude underlying malignancy. Electronically Signed   By: Inez Catalina M.D.   On: 11/10/2015 12:39    Microbiology: Recent Results (from the past 240 hour(s))  Culture, blood (routine x 2)     Status: None (Preliminary result)    Collection Time: 11/10/15  1:41 PM  Result Value Ref Range Status   Specimen Description BLOOD LEFT ANTECUBITAL  Final   Special Requests BOTTLES DRAWN AEROBIC AND ANAEROBIC 5ML  Final   Culture   Final    NO GROWTH < 24 HOURS Performed at [facility]    Report Status PENDING  Incomplete  Culture, blood (routine x 2)     Status: None (Preliminary result)   Collection Time: 11/10/15  2:00 PM  Result Value Ref Range Status   Specimen Description BLOOD RIGHT ARM  Final   Special Requests BOTTLES DRAWN AEROBIC AND ANAEROBIC 5ML  Final   Culture   Final    NO GROWTH < 24 HOURS Performed at [facility]    Report Status PENDING  Incomplete  MRSA PCR Screening     Status: None   Collection Time: 11/10/15  5:09 PM  Result Value Ref Range Status   MRSA by PCR NEGATIVE NEGATIVE Final    Comment:        The GeneXpert MRSA Assay (FDA approved for NASAL specimens only), is one component of a comprehensive MRSA colonization surveillance program. It is not intended to diagnose MRSA infection nor to guide or monitor treatment for MRSA infections.      Labs: Basic Metabolic Panel:  Recent Labs Lab 11/10/15 1211 11/11/15 0325  NA 134* 137  K 4.2 4.3  CL 99* 102  CO2 26 29  GLUCOSE 120* 129*  BUN 14 13  CREATININE 0.74 0.65  CALCIUM 8.8* 8.7*   Liver Function Tests: No results for input(s): AST, ALT, ALKPHOS, BILITOT, PROT, ALBUMIN in the last 168 hours. No results for input(s): LIPASE, AMYLASE in the last 168 hours. No results for input(s): AMMONIA in the last 168 hours. CBC:  Recent Labs Lab 11/10/15 1211 11/10/15 1730 11/11/15 0325  WBC 31.1* 29.8* 30.8*  NEUTROABS  --  27.9*  --   HGB 12.6* 12.3* 12.7*  HCT 37.2* 35.9* 36.2*  MCV 96.1 95.7 96.0  PLT 296 284 331   Cardiac Enzymes: No results for input(s): CKTOTAL, CKMB, CKMBINDEX, TROPONINI in the last 168 hours. BNP: BNP (last 3 results) No results for input(s): BNP in the last 8760  hours.  ProBNP (last 3 results) No results for input(s): PROBNP in the last 8760 hours.  CBG:  Recent Labs Lab 11/10/15 2034  GLUCAP 213*

## 2015-11-12 NOTE — Progress Notes (Signed)
Ambulated with patient in the hall without 02.  Patient tolerated well.  O2 sats ranged from 91-94%, MD aware placed discharge orders.

## 2015-11-12 NOTE — Discharge Instructions (Signed)
Community-Acquired Pneumonia, Adult °Pneumonia is an infection of the lungs. One type of pneumonia can happen while a person is in a hospital. A different type can happen when a person is not in a hospital (community-acquired pneumonia). It is easy for this kind to spread from person to person. It can spread to you if you breathe near an infected person who coughs or sneezes. Some symptoms include: °· A dry cough. °· A wet (productive) cough. °· Fever. °· Sweating. °· Chest pain. °HOME CARE °· Take over-the-counter and prescription medicines only as told by your doctor. °¨ Only take cough medicine if you are losing sleep. °¨ If you were prescribed an antibiotic medicine, take it as told by your doctor. Do not stop taking the antibiotic even if you start to feel better. °· Sleep with your head and neck raised (elevated). You can do this by putting a few pillows under your head, or you can sleep in a recliner. °· Do not use tobacco products. These include cigarettes, chewing tobacco, and e-cigarettes. If you need help quitting, ask your doctor. °· Drink enough water to keep your pee (urine) clear or pale yellow. °A shot (vaccine) can help prevent pneumonia. Shots are often suggested for: °· People older than 65 years of age. °· People older than 64 years of age: °¨ Who are having cancer treatment. °¨ Who have long-term (chronic) lung disease. °¨ Who have problems with their body's defense system (immune system). °You may also prevent pneumonia if you take these actions: °· Get the flu (influenza) shot every year. °· Go to the dentist as often as told. °· Wash your hands often. If soap and water are not available, use hand sanitizer. °GET HELP IF: °· You have a fever. °· You lose sleep because your cough medicine does not help. °GET HELP RIGHT AWAY IF: °· You are short of breath and it gets worse. °· You have more chest pain. °· Your sickness gets worse. This is very serious if: °¨ You are an older adult. °¨ Your  body's defense system is weak. °· You cough up blood. °  °This information is not intended to replace advice given to you by your health care provider. Make sure you discuss any questions you have with your health care provider. °  °Document Released: 05/23/2008 Document Revised: 08/26/2015 Document Reviewed: 04/01/2015 °Elsevier Interactive Patient Education ©2016 [employer]. ° °Levofloxacin tablets °What is this medicine? °LEVOFLOXACIN (lee voe FLOX a sin) is a quinolone antibiotic. It is used to treat certain kinds of bacterial infections. It will not work for colds, flu, or other viral infections. °This medicine may be used for other purposes; ask your health care provider or pharmacist if you have questions. °What should I tell my health care provider before I take this medicine? °They need to know if you have any of these conditions: °-bone problems °-cerebral disease °-history of low levels of potassium in the blood °-irregular heartbeat °-joint problems °-kidney disease °-myasthenia gravis °-seizures °-tendon problems °-tingling of the fingers or toes, or other nerve disorder °-an unusual or allergic reaction to levofloxacin, other quinolone antibiotics, foods, dyes, or preservatives °-pregnant or trying to get pregnant °-breast-feeding °How should I use this medicine? °Take this medicine by mouth with a full glass of water. Follow the directions on the prescription label. This medicine can be taken with or without food. Take your medicine at regular intervals. Do not take your medicine more often than directed. Do not skip doses or   stop your medicine early even if you feel better. Do not stop taking except on your doctor's advice. °A special MedGuide will be given to you by the pharmacist with each prescription and refill. Be sure to read this information carefully each time. °Talk to your pediatrician regarding the use of this medicine in children. While this drug may be prescribed for children as young  as 6 months for selected conditions, precautions do apply. °Overdosage: If you think you have taken too much of this medicine contact a poison control center or emergency room at once. °NOTE: This medicine is only for you. Do not share this medicine with others. °What if I miss a dose? °If you miss a dose, take it as soon as you remember. If it is almost time for your next dose, take only that dose. Do not take double or extra doses. °What may interact with this medicine? °Do not take this medicine with any of the following medications: °-arsenic trioxide °-chloroquine °-droperidol °-medicines for irregular heart rhythm like amiodarone, disopyramide, dofetilide, flecainide, quinidine, procainamide, sotalol °-some medicines for depression or mental problems like phenothiazines, pimozide, and ziprasidone °This medicine may also interact with the following medications: °-amoxapine °-antacids °-birth control pills °-cisapride °-dairy products °-didanosine (ddI) buffered tablets or powder °-haloperidol °-multivitamins °-NSAIDS, medicines for pain and inflammation, like ibuprofen or naproxen °-retinoid products like tretinoin or isotretinoin °-risperidone °-some other antibiotics like clarithromycin or erythromycin °-sucralfate °-theophylline °-warfarin °This list may not describe all possible interactions. Give your health care provider a list of all the medicines, herbs, non-prescription drugs, or dietary supplements you use. Also tell them if you smoke, drink alcohol, or use illegal drugs. Some items may interact with your medicine. °What should I watch for while using this medicine? °Tell your doctor or health care professional if your symptoms do not improve or if they get worse. Drink several glasses of water a day and cut down on drinks that contain caffeine. You must not get dehydrated while taking this medicine. °You may get drowsy or dizzy. Do not drive, use machinery, or do anything that needs mental alertness  until you know how this medicine affects you. Do not sit or stand up quickly, especially if you are an older patient. This reduces the risk of dizzy or fainting spells. °This medicine can make you more sensitive to the sun. Keep out of the sun. If you cannot avoid being in the sun, wear protective clothing and use a sunscreen. Do not use sun lamps or tanning beds/booths. Contact your doctor if you get a sunburn. °If you are a diabetic monitor your blood glucose carefully. If you get an unusual reading stop taking this medicine and call your doctor right away. °Do not treat diarrhea with over-the-counter products. Contact your doctor if you have diarrhea that lasts more than 2 days or if the diarrhea is severe and watery. °Avoid antacids, calcium, iron, and zinc products for 2 hours before and 2 hours after taking a dose of this medicine. °What side effects may I notice from receiving this medicine? °Side effects that you should report to your doctor or health care professional as soon as possible: °-allergic reactions like skin rash or hives, swelling of the face, lips, or tongue °-anxious °-confusion °-depressed mood °-diarrhea °-fast, irregular heartbeat °-hallucination, loss of contact with reality °-joint, muscle, or tendon pain or swelling °-pain, tingling, numbness in the hands or feet °-suicidal thoughts or other mood changes °-sunburn °-unusually weak or tired °Side effects that usually   do not require medical attention (report to your doctor or health care professional if they continue or are bothersome): °-dry mouth °-headache °-nausea °-trouble sleeping °This list may not describe all possible side effects. Call your doctor for medical advice about side effects. You may report side effects to FDA at 1-800-FDA-1088. °Where should I keep my medicine? °Keep out of the reach of children. °Store at room temperature between 15 and 30 degrees C (59 and 86 degrees F). Keep in a tightly closed container. Throw away  any unused medicine after the expiration date. °NOTE: This sheet is a summary. It may not cover all possible information. If you have questions about this medicine, talk to your doctor, pharmacist, or health care provider. °  °© 2016, Elsevier/[employer]. (2015-07-16 12:40:18) ° °

## 2015-11-15 LAB — CULTURE, BLOOD (ROUTINE X 2)
Culture: NO GROWTH
Culture: NO GROWTH

## 2015-11-18 ENCOUNTER — Encounter (HOSPITAL_COMMUNITY): Payer: Self-pay

## 2015-11-18 ENCOUNTER — Emergency Department (HOSPITAL_COMMUNITY)
Admission: EM | Admit: 2015-11-18 | Discharge: 2015-11-18 | Disposition: A | Discharge status: Home / Self Care | Payer: No Typology Code available for payment source | Attending: Emergency Medicine | Admitting: Emergency Medicine

## 2015-11-18 DIAGNOSIS — Y9289 Other specified places as the place of occurrence of the external cause: Secondary | ICD-10-CM | POA: Insufficient documentation

## 2015-11-18 DIAGNOSIS — Y998 Other external cause status: Secondary | ICD-10-CM | POA: Diagnosis not present

## 2015-11-18 DIAGNOSIS — S61219A Laceration without foreign body of unspecified finger without damage to nail, initial encounter: Secondary | ICD-10-CM

## 2015-11-18 DIAGNOSIS — Z79899 Other long term (current) drug therapy: Secondary | ICD-10-CM | POA: Diagnosis not present

## 2015-11-18 DIAGNOSIS — S61217A Laceration without foreign body of left little finger without damage to nail, initial encounter: Secondary | ICD-10-CM | POA: Diagnosis present

## 2015-11-18 DIAGNOSIS — W231XXA Caught, crushed, jammed, or pinched between stationary objects, initial encounter: Secondary | ICD-10-CM | POA: Insufficient documentation

## 2015-11-18 DIAGNOSIS — Z23 Encounter for immunization: Secondary | ICD-10-CM | POA: Diagnosis not present

## 2015-11-18 DIAGNOSIS — F17210 Nicotine dependence, cigarettes, uncomplicated: Secondary | ICD-10-CM | POA: Diagnosis not present

## 2015-11-18 DIAGNOSIS — Z792 Long term (current) use of antibiotics: Secondary | ICD-10-CM | POA: Insufficient documentation

## 2015-11-18 DIAGNOSIS — Y9389 Activity, other specified: Secondary | ICD-10-CM | POA: Diagnosis not present

## 2015-11-18 MED ORDER — TETANUS-DIPHTH-ACELL PERTUSSIS 5-2.5-18.5 LF-MCG/0.5 IM SUSP
0.5000 mL | Freq: Once | INTRAMUSCULAR | Status: AC
  Administered 2015-11-18: 0.5 mL via INTRAMUSCULAR
  Filled 2015-11-18: qty 0.5

## 2015-11-18 MED ORDER — CEPHALEXIN 500 MG PO CAPS
500.0000 mg | ORAL_CAPSULE | Freq: Once | ORAL | Status: AC
  Administered 2015-11-18: 500 mg via ORAL
  Filled 2015-11-18: qty 1

## 2015-11-18 MED ORDER — CEPHALEXIN 500 MG PO CAPS: 500.0000 mg | ORAL_CAPSULE | Freq: Four times a day (QID) | ORAL | Status: DC

## 2015-11-18 NOTE — ED Notes (Signed)
Patient's R ring finger placed in water with betadine to soak.

## 2015-11-18 NOTE — Discharge Instructions (Signed)
Wound Care °Taking care of your wound properly can help to prevent pain and infection. It can also help your wound to heal more quickly.  °HOW TO CARE FOR YOUR WOUND  °· Take or apply over-the-counter and prescription medicines only as told by your health care provider. °· If you were prescribed antibiotic medicine, take or apply it as told by your health care provider. Do not stop using the antibiotic even if your condition improves. °· Clean the wound each day or as told by your health care provider. °¨ Wash the wound with mild soap and water. °¨ Rinse the wound with water to remove all soap. °¨ Pat the wound dry with a clean towel. Do not rub it. °· There are many different ways to close and cover a wound. For example, a wound can be covered with stitches (sutures), skin glue, or adhesive strips. Follow instructions from your health care provider about: °¨ How to take care of your wound. °¨ When and how you should change your bandage (dressing). °¨ When you should remove your dressing. °¨ Removing whatever was used to close your wound. °· Check your wound every day for signs of infection. Watch for: °¨ Redness, swelling, or pain. °¨ Fluid, blood, or pus. °· Keep the dressing dry until your health care provider says it can be removed. Do not take baths, swim, use a hot tub, or do anything that would put your wound underwater until your health care provider approves. °· Raise (elevate) the injured area above the level of your heart while you are sitting or lying down. °· Do not scratch or pick at the wound. °· Keep all follow-up visits as told by your health care provider. This is important. °SEEK MEDICAL CARE IF: °· You received a tetanus shot and you have swelling, severe pain, redness, or bleeding at the injection site. °· You have a fever. °· Your pain is not controlled with medicine. °· You have increased redness, swelling, or pain at the site of your wound. °· You have fluid, blood, or pus coming from your  wound. °· You notice a bad smell coming from your wound or your dressing. °SEEK IMMEDIATE MEDICAL CARE IF: °· You have a red streak going away from your wound. °  °This information is not intended to replace advice given to you by your health care provider. Make sure you discuss any questions you have with your health care provider. °  °Document Released: 09/13/2008 Document Revised: 04/21/2015 Document Reviewed: 12/01/2014 °Elsevier Interactive Patient Education ©2016 Elsevier Inc. ° °

## 2015-11-18 NOTE — ED Provider Notes (Signed)
CSN: [id number]     Arrival date & time 11/18/15  1812 History   By signing my name below, I, Devon Wall, attest that this documentation has been prepared under the direction and in the presence of Abdifatah Colquhoun PA-C.  Electronically Signed: Arlan OrganAshley Wall, ED Scribe. 11/18/2015. 7:33 PM.   Chief Complaint  Patient presents with  . Extremity Laceration   The history is provided by the patient. No language interpreter was used.    HPI Comments: Katrine CohoJames Gruel is a 64 y.o. male who presents to the Emergency Department here for a laceration to the R ring finger sustained just prior to arrival. Pt states he was pulling the front wooden door closed when his finger got caught inside the door. Bleeding controlled at this time and some skin pulled away from finger. He now c/o constant, ongoing pain to the open area. Currently he rates pain 10/10. No OTC topical ointments attempted to area. No cleansing attempted prior to coming in for evaluation. No recent fever or chills. Tetanus unknown.  PCP: Willey BladeEAN, ERIC, MD    History reviewed. No pertinent past medical history. History reviewed. No pertinent past surgical history. History reviewed. No pertinent family history. Social History  Substance Use Topics  . Smoking status: Light Tobacco Smoker    Types: Cigars  . Smokeless tobacco: Never Used  . Alcohol Use: No    Review of Systems  Constitutional: Negative for fever and chills.  Skin: Positive for wound.      Allergies  Review of patient's allergies indicates no known allergies.  Home Medications   Prior to Admission medications   Medication Sig Start Date End Date Taking? Authorizing Provider  albuterol (PROVENTIL HFA;VENTOLIN HFA) 108 (90 BASE) MCG/ACT inhaler Inhale 2 puffs into the lungs every 6 (six) hours as needed for wheezing or shortness of breath. 10/13/14   Nishant Dhungel, MD  feeding supplement, ENSURE COMPLETE, (ENSURE COMPLETE) LIQD Take 237 mLs by mouth 3 (three)  times daily between meals. 10/13/14   Nishant Dhungel, MD  guaifenesin (ROBITUSSIN) 100 MG/5ML syrup Take 10 mLs (200 mg total) by mouth every 4 (four) hours as needed for cough or congestion. 10/13/14   Nishant Dhungel, MD  levofloxacin (LEVAQUIN) 750 MG tablet Take 1 tablet (750 mg total) by mouth daily. 11/12/15   Alison MurrayAlma M Devine, MD  PRESCRIPTION MEDICATION Take 1 tablet by mouth once. Pain killer    Historical Provider, MD   Triage Vitals: BP 108/85 mmHg  Pulse 84  Temp(Src) 98.4 F (36.9 C) (Oral)  Resp 17  SpO2 100%   Physical Exam  Constitutional: He is oriented to person, place, and time. He appears well-developed and well-nourished.  HENT:  Head: Normocephalic.  Eyes: EOM are normal.  Neck: Normal range of motion.  Pulmonary/Chest: Effort normal.  Musculoskeletal: Normal range of motion.  FROM all digits of left little finger.   Neurological: He is alert and oriented to person, place, and time.  Skin:  Shallow skin tear at the base of the left little finger. No FB visualized.  Psychiatric: He has a normal mood and affect.  Nursing note and vitals reviewed.   ED Course  Procedures (including critical care time)  DIAGNOSTIC STUDIES: Oxygen Saturation is 100% on RA, Normal by my interpretation.    COORDINATION OF CARE: 7:30 PM- Will send home with a prescription for an antibiotic. Discussed treatment plan with pt at bedside and pt agreed to plan.     Labs Review Labs Reviewed - No  data to display  Imaging Review No results found. I have personally reviewed and evaluated these images and lab results as part of my medical decision-making.   EKG Interpretation None      MDM   Final diagnoses:  None    1. Laceration left finger  No suture felt beneficial in shallow skin tear. Wound cleaned, topical abx applied and bandaged placed.   I personally performed the services described in this documentation, which was scribed in my presence. The recorded  information has been reviewed and is accurate.     Elpidio Anis, PA-C 11/20/15 [id number]  Zadie Rhine, MD 11/20/15 [phone]

## 2015-11-18 NOTE — ED Notes (Signed)
Patient c/o right finger pain.  Patient states that was pulling the front wooden door closed and finger got caught in the door.  Patient has laceration controlled bleeding and some skin pulled away on the right pinky finger.  Patient states has pain 10/10.  NAD at this time.

## 2015-12-31 ENCOUNTER — Emergency Department (HOSPITAL_COMMUNITY): Payer: PPO

## 2015-12-31 ENCOUNTER — Emergency Department (HOSPITAL_COMMUNITY)
Admission: EM | Admit: 2015-12-31 | Discharge: 2015-12-31 | Disposition: A | Discharge status: Home / Self Care | Payer: PPO | Attending: Emergency Medicine | Admitting: Emergency Medicine

## 2015-12-31 ENCOUNTER — Encounter (HOSPITAL_COMMUNITY): Payer: Self-pay | Admitting: Emergency Medicine

## 2015-12-31 DIAGNOSIS — Z8701 Personal history of pneumonia (recurrent): Secondary | ICD-10-CM | POA: Insufficient documentation

## 2015-12-31 DIAGNOSIS — R109 Unspecified abdominal pain: Secondary | ICD-10-CM

## 2015-12-31 DIAGNOSIS — R3915 Urgency of urination: Secondary | ICD-10-CM | POA: Insufficient documentation

## 2015-12-31 DIAGNOSIS — R0902 Hypoxemia: Secondary | ICD-10-CM | POA: Insufficient documentation

## 2015-12-31 DIAGNOSIS — Z79899 Other long term (current) drug therapy: Secondary | ICD-10-CM | POA: Insufficient documentation

## 2015-12-31 DIAGNOSIS — J449 Chronic obstructive pulmonary disease, unspecified: Secondary | ICD-10-CM | POA: Insufficient documentation

## 2015-12-31 DIAGNOSIS — F17210 Nicotine dependence, cigarettes, uncomplicated: Secondary | ICD-10-CM | POA: Insufficient documentation

## 2015-12-31 DIAGNOSIS — R1084 Generalized abdominal pain: Secondary | ICD-10-CM | POA: Insufficient documentation

## 2015-12-31 DIAGNOSIS — R509 Fever, unspecified: Secondary | ICD-10-CM | POA: Diagnosis not present

## 2015-12-31 LAB — URINALYSIS, ROUTINE W REFLEX MICROSCOPIC
Bilirubin Urine: NEGATIVE
Glucose, UA: NEGATIVE mg/dL
Hgb urine dipstick: NEGATIVE
Ketones, ur: NEGATIVE mg/dL
Leukocytes, UA: NEGATIVE
Nitrite: NEGATIVE
Protein, ur: NEGATIVE mg/dL
Specific Gravity, Urine: 1.022 (ref 1.005–1.030)
pH: 6 (ref 5.0–8.0)

## 2015-12-31 LAB — BASIC METABOLIC PANEL
Anion gap: 9 (ref 5–15)
BUN: 12 mg/dL (ref 6–20)
CO2: 28 mmol/L (ref 22–32)
Calcium: 8.9 mg/dL (ref 8.9–10.3)
Chloride: 100 mmol/L — ABNORMAL LOW (ref 101–111)
Creatinine, Ser: 0.89 mg/dL (ref 0.61–1.24)
GFR calc Af Amer: >60 mL/min (ref >60)
GFR calc non Af Amer: >60 mL/min (ref >60)
Glucose, Bld: 100 mg/dL — ABNORMAL HIGH (ref 65–99)
Potassium: 4.5 mmol/L (ref 3.5–5.1)
Sodium: 137 mmol/L (ref 135–145)

## 2015-12-31 LAB — CBC WITH DIFFERENTIAL/PLATELET
Basophils Absolute: 0 10*3/uL (ref 0.0–0.1)
Basophils Relative: 0 %
Eosinophils Absolute: 0 10*3/uL (ref 0.0–0.7)
Eosinophils Relative: 0 %
HCT: 39.3 % (ref 39.0–52.0)
Hemoglobin: 12.9 g/dL — ABNORMAL LOW (ref 13.0–17.0)
Lymphocytes Relative: 13 %
Lymphs Abs: 2.4 10*3/uL (ref 0.7–4.0)
MCH: 32.3 pg (ref 26.0–34.0)
MCHC: 32.8 g/dL (ref 30.0–36.0)
MCV: 98.3 fL (ref 78.0–100.0)
Monocytes Absolute: 1.4 10*3/uL — ABNORMAL HIGH (ref 0.1–1.0)
Monocytes Relative: 8 %
Neutro Abs: 14.5 10*3/uL — ABNORMAL HIGH (ref 1.7–7.7)
Neutrophils Relative %: 79 %
Platelets: 227 10*3/uL (ref 150–400)
RBC: 4 MIL/uL — ABNORMAL LOW (ref 4.22–5.81)
RDW: 14 % (ref 11.5–15.5)
WBC: 18.3 10*3/uL — ABNORMAL HIGH (ref 4.0–10.5)

## 2015-12-31 MED ORDER — OXYCODONE-ACETAMINOPHEN 5-325 MG PO TABS: 1.0000 | ORAL_TABLET | ORAL | Status: DC | PRN

## 2015-12-31 MED ORDER — IOHEXOL 300 MG/ML  SOLN
25.0000 mL | Freq: Once | INTRAMUSCULAR | Status: AC | PRN
  Administered 2015-12-31: 25 mL via ORAL

## 2015-12-31 MED ORDER — ACETAMINOPHEN 325 MG PO TABS
650.0000 mg | ORAL_TABLET | Freq: Once | ORAL | Status: AC
  Administered 2015-12-31: 650 mg via ORAL
  Filled 2015-12-31: qty 2

## 2015-12-31 MED ORDER — SODIUM CHLORIDE 0.9 % IV SOLN
INTRAVENOUS | Status: DC
  Administered 2015-12-31: 08:00:00 via INTRAVENOUS

## 2015-12-31 MED ORDER — KETOROLAC TROMETHAMINE 15 MG/ML IJ SOLN: 7.5000 mg | Freq: Once | INTRAMUSCULAR | Status: DC

## 2015-12-31 MED ORDER — IOHEXOL 300 MG/ML  SOLN
100.0000 mL | Freq: Once | INTRAMUSCULAR | Status: AC | PRN
  Administered 2015-12-31: 100 mL via INTRAVENOUS

## 2015-12-31 MED ORDER — MORPHINE SULFATE (PF) 4 MG/ML IV SOLN: 4.0000 mg | Freq: Once | INTRAVENOUS | Status: DC

## 2015-12-31 NOTE — ED Notes (Signed)
Pt is c/o left flank pain that started on Wednesday  Pt denies N/V/D  Pt states when he needs to urinate he has to go then and cannot wait  Pt states this is not normal for him  Pt has low oxygen saturation in triage  Pt recently had pneumonia

## 2015-12-31 NOTE — ED Provider Notes (Signed)
CSN: [id number]     Arrival date & time 12/31/15  0230 History   First MD Initiated Contact with Patient 12/31/15 [phone]     Chief Complaint  Patient presents with  . Flank Pain     (Consider location/radiation/quality/duration/timing/severity/associated sxs/prior Treatment) HPI   65 year old male with left flank pain. Gradual onset about 2 days ago. Progressively worsening. Pain is constant. Associated with urinary urgency. No dysuria. Subjective fever. No nausea or vomiting.  Patient was noted to be mildly hypoxic on arrival. He has no acute respiratory complaints though. Hx of COPD. Denies baseline O2 use. Admission ~6 weeks ago with streptococcus pneumonia.   History reviewed. No pertinent past medical history. History reviewed. No pertinent past surgical history. History reviewed. No pertinent family history. Social History  Substance Use Topics  . Smoking status: Light Tobacco Smoker    Types: Cigars  . Smokeless tobacco: Never Used  . Alcohol Use: No    Review of Systems  All systems reviewed and negative, other than as noted in HPI.   Allergies  Review of patient's allergies indicates no known allergies.  Home Medications   Prior to Admission medications   Medication Sig Start Date End Date Taking? Authorizing Provider  albuterol (PROVENTIL HFA;VENTOLIN HFA) 108 (90 BASE) MCG/ACT inhaler Inhale 2 puffs into the lungs every 6 (six) hours as needed for wheezing or shortness of breath. 10/13/14  Yes Nishant Dhungel, MD  cephALEXin (KEFLEX) 500 MG capsule Take 1 capsule (500 mg total) by mouth 4 (four) times daily. Patient not taking: Reported on 12/31/2015 11/18/15   Elpidio Anis, PA-C  feeding supplement, ENSURE COMPLETE, (ENSURE COMPLETE) LIQD Take 237 mLs by mouth 3 (three) times daily between meals. Patient not taking: Reported on 12/31/2015 10/13/14   Nishant Dhungel, MD  guaifenesin (ROBITUSSIN) 100 MG/5ML syrup Take 10 mLs (200 mg total) by mouth every 4 (four)  hours as needed for cough or congestion. Patient not taking: Reported on 12/31/2015 10/13/14   Nishant Dhungel, MD  levofloxacin (LEVAQUIN) 750 MG tablet Take 1 tablet (750 mg total) by mouth daily. Patient not taking: Reported on 12/31/2015 11/12/15   Alison Murray, MD   BP 124/82 mmHg  Pulse 84  Temp(Src) 102.2 F (39 C) (Oral)  Resp 16  Ht 6\' 1"  (1.854 m)  Wt 160 lb (72.576 kg)  BMI 21.11 kg/m2  SpO2 99% Physical Exam  Constitutional: He appears well-developed and well-nourished. No distress.  Sleeping in bed. NAD.   HENT:  Head: Normocephalic and atraumatic.  Eyes: Conjunctivae are normal. Right eye exhibits no discharge. Left eye exhibits no discharge.  Neck: Neck supple.  Cardiovascular: Normal rate, regular rhythm and normal heart sounds.  Exam reveals no gallop and no friction rub.   No murmur heard. Pulmonary/Chest: Effort normal and breath sounds normal. No respiratory distress.  Abdominal: Soft. He exhibits no distension. There is tenderness.  Diffuse abdominal tenderness, worse L side. L CVA tenderness.   Musculoskeletal: He exhibits no edema or tenderness.  Neurological: He is alert.  Skin: Skin is warm and dry.  Psychiatric: He has a normal mood and affect. His behavior is normal. Thought content normal.  Nursing note and vitals reviewed.   ED Course  Procedures (including critical care time) Labs Review Labs Reviewed  CBC WITH DIFFERENTIAL/PLATELET - Abnormal; Notable for the following:    WBC 18.3 (*)    RBC 4.00 (*)    Hemoglobin 12.9 (*)    Neutro Abs 14.5 (*)    Monocytes  Absolute 1.4 (*)    All other components within normal limits  BASIC METABOLIC PANEL - Abnormal; Notable for the following:    Chloride 100 (*)    Glucose, Bld 100 (*)    All other components within normal limits  URINE CULTURE  URINALYSIS, ROUTINE W REFLEX MICROSCOPIC (NOT AT [facility])    Imaging Review Dg Chest 2 View  12/31/2015  CLINICAL DATA:  Recent pneumonia. New left lateral  chest and flank pain with urinary urgency. EXAM: CHEST  2 VIEW COMPARISON:  Radiographs 11/10/2015 and 10/09/2014. FINDINGS: The heart size and mediastinal contours are stable. There is chronic left basilar scarring, similar to the 2015 examination. The superimposed airspace disease in the lingula on the most recent study has largely cleared. No new airspace disease or significant pleural effusion identified. The bones appear unchanged. IMPRESSION: Partial clearing of previously demonstrated lingular airspace disease approximately 7 weeks ago. Stable chronic left lower lobe scarring. No acute findings. Electronically Signed   By: Carey BullocksWilliam  Veazey M.D.   On: 12/31/2015 07:47   I have personally reviewed and evaluated these images and lab results as part of my medical decision-making.   EKG Interpretation None      MDM   Final diagnoses:  Left flank pain  Fever, unspecified fever cause    65 year old male with fever, left flank pain and urinary urgency. Consider pyelonephritis. Mild hypoxemia noted. No acute respiratory complaints. Has hx of COPD. Will check CXR to evaluate for possible LLL pneumonia as cause of pain/fever, although I feel less likely.   UA actually unremarkable. Tenderness is more diffuse than typically see with UTI/pyelo. Will CT. Diverticulitis?  CT unremarkable. Feels significantly better now. Unclear source of fever. Strict return precautions were discussed.     Raeford RazorStephen Sadiel Mota, MD 01/03/16 1146

## 2015-12-31 NOTE — Discharge Instructions (Signed)
Abdominal Pain, Adult Many things can cause abdominal pain. Usually, abdominal pain is not caused by a disease and will improve without treatment. It can often be observed and treated at home. Your health care provider will do a physical exam and possibly order blood tests and X-rays to help determine the seriousness of your pain. However, in many cases, more time must pass before a clear cause of the pain can be found. Before that point, your health care provider may not know if you need more testing or further treatment. HOME CARE INSTRUCTIONS Monitor your abdominal pain for any changes. The following actions may help to alleviate any discomfort you are experiencing:  Only take over-the-counter or prescription medicines as directed by your health care provider.  Do not take laxatives unless directed to do so by your health care provider.  Try a clear liquid diet (broth, tea, or water) as directed by your health care provider. Slowly move to a bland diet as tolerated. SEEK MEDICAL CARE IF:  You have unexplained abdominal pain.  You have abdominal pain associated with nausea or diarrhea.  You have pain when you urinate or have a bowel movement.  You experience abdominal pain that wakes you in the night.  You have abdominal pain that is worsened or improved by eating food.  You have abdominal pain that is worsened with eating fatty foods.  You have a fever. SEEK IMMEDIATE MEDICAL CARE IF:  Your pain does not go away within 2 hours.  You keep throwing up (vomiting).  Your pain is felt only in portions of the abdomen, such as the right side or the left lower portion of the abdomen.  You pass bloody or black tarry stools. MAKE SURE YOU:  Understand these instructions.  Will watch your condition.  Will get help right away if you are not doing well or get worse.   This information is not intended to replace advice given to you by your health care provider. Make sure you discuss  any questions you have with your health care provider.   Document Released: 09/14/2005 Document Revised: 08/26/2015 Document Reviewed: 08/14/2013 Elsevier Interactive Patient Education 2016 [employer]  Fever, Adult A fever is an increase in the body's temperature. It is usually defined as a temperature of 100F (38C) or higher. Brief mild or moderate fevers generally have no long-term effects, and they often do not require treatment. Moderate or high fevers may make you feel uncomfortable and can sometimes be a sign of a serious illness or disease. The sweating that may occur with repeated or prolonged fever may also cause dehydration. Fever is confirmed by taking a temperature with a thermometer. A measured temperature can vary with:  Age.  Time of day.  Location of the thermometer:  Mouth (oral).  Rectum (rectal).  Ear (tympanic).  Underarm (axillary).  Forehead (temporal). HOME CARE INSTRUCTIONS Pay attention to any changes in your symptoms. Take these actions to help with your condition:  Take over-the counter and prescription medicines only as told by your health care provider. Follow the dosing instructions carefully.  If you were prescribed an antibiotic medicine, take it as told by your health care provider. Do not stop taking the antibiotic even if you start to feel better.  Rest as needed.  Drink enough fluid to keep your urine clear or pale yellow. This helps to prevent dehydration.  Sponge yourself or bathe with room-temperature water to help reduce your body temperature as needed. Do not use ice water.  Do not overbundle yourself in blankets or heavy clothes. SEEK MEDICAL CARE IF:  You vomit.  You cannot eat or drink without vomiting.  You have diarrhea.  You have pain when you urinate.  Your symptoms do not improve with treatment.  You develop new symptoms.  You develop excessive weakness. SEEK IMMEDIATE MEDICAL CARE IF:  You have shortness of  breath or have trouble breathing.  You are dizzy or you faint.  You are disoriented or confused.  You develop signs of dehydration, such as a dry mouth, decreased urination, or paleness.  You develop severe pain in your abdomen.  You have persistent vomiting or diarrhea.  You develop a skin rash.  Your symptoms suddenly get worse.   This information is not intended to replace advice given to you by your health care provider. Make sure you discuss any questions you have with your health care provider.   Document Released: 05/31/2001 Document Revised: 08/26/2015 Document Reviewed: 01/29/2015 Elsevier Interactive Patient Education [employer]

## 2016-01-01 LAB — URINE CULTURE

## 2016-01-17 MED ORDER — OXYCODONE-ACETAMINOPHEN 5-325 MG PO TABS: 1.0000 | ORAL_TABLET | ORAL | Status: DC | PRN

## 2016-02-12 IMAGING — CR DG CHEST 2V
2 series · 2 of 2 positions shown · non-contrast
Comparison: Radiographs 11/10/2015 and 10/09/2014.

CLINICAL DATA: Recent pneumonia. New left lateral chest and flank
pain with urinary urgency.

EXAM:
CHEST  2 VIEW

[w chest pa]
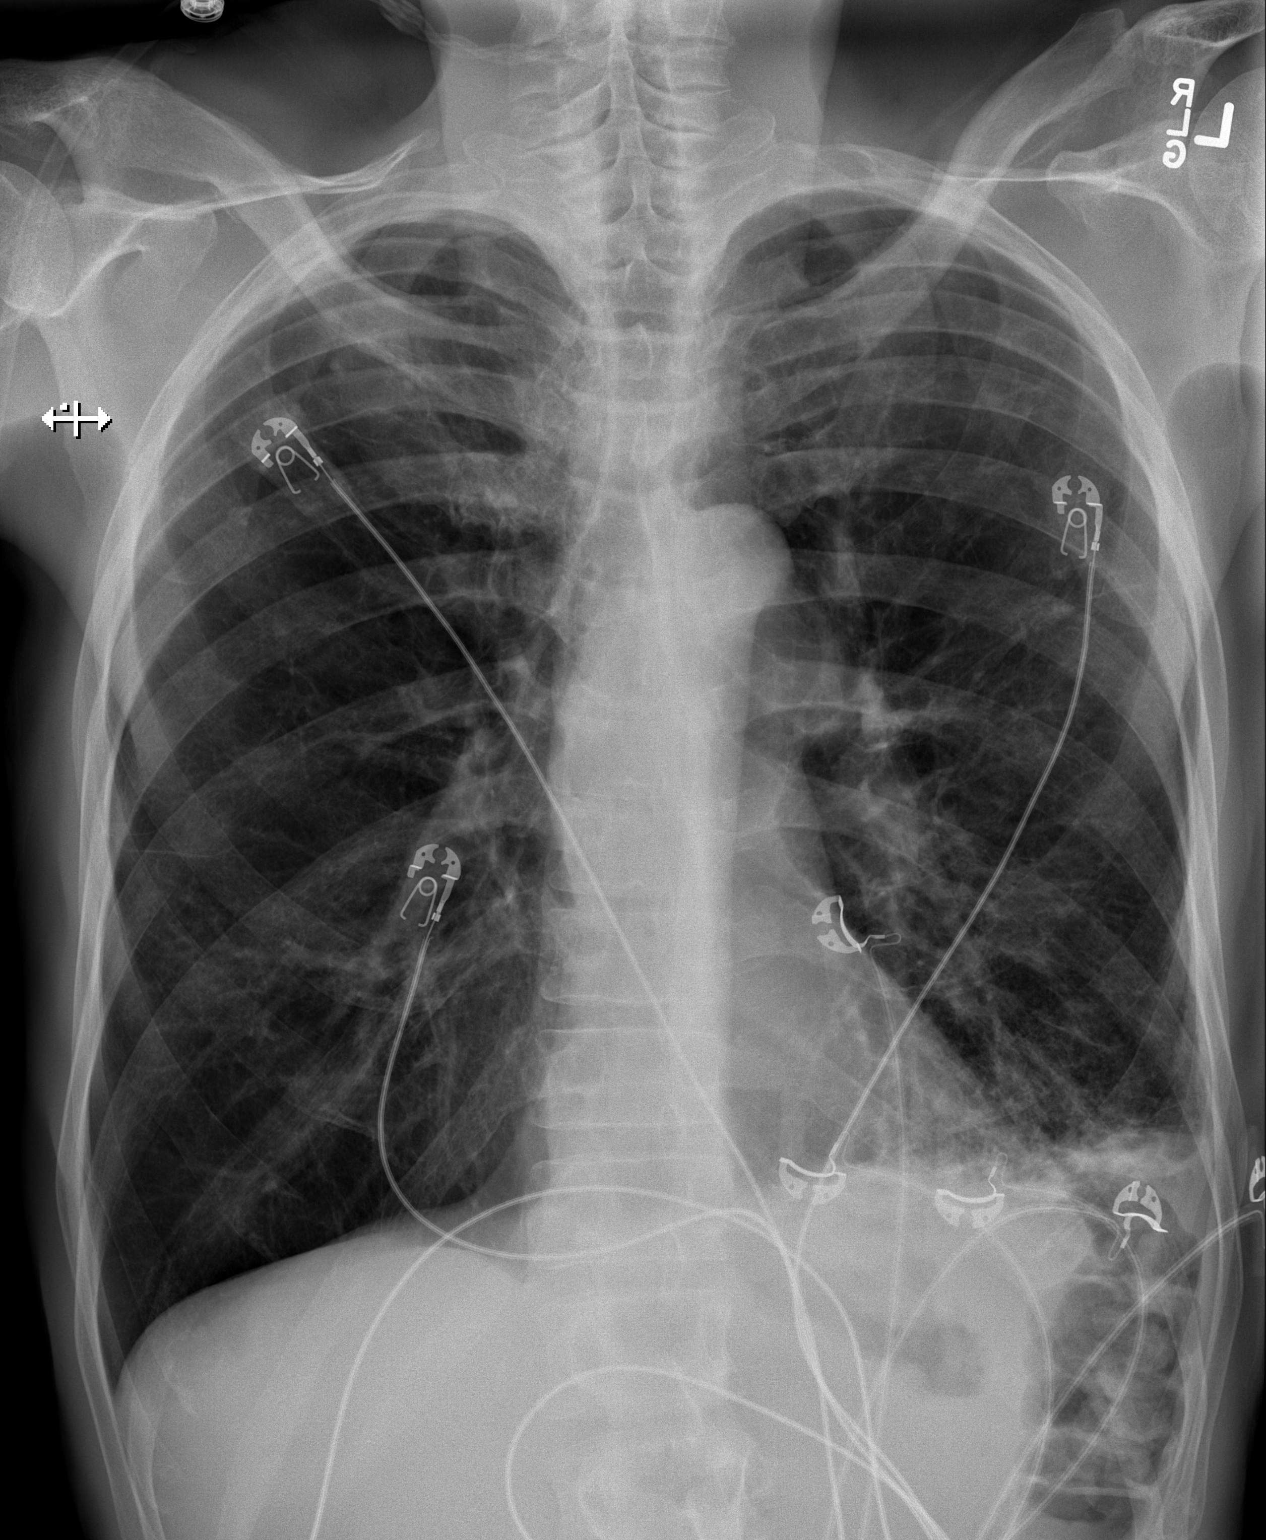

[w chest lat]
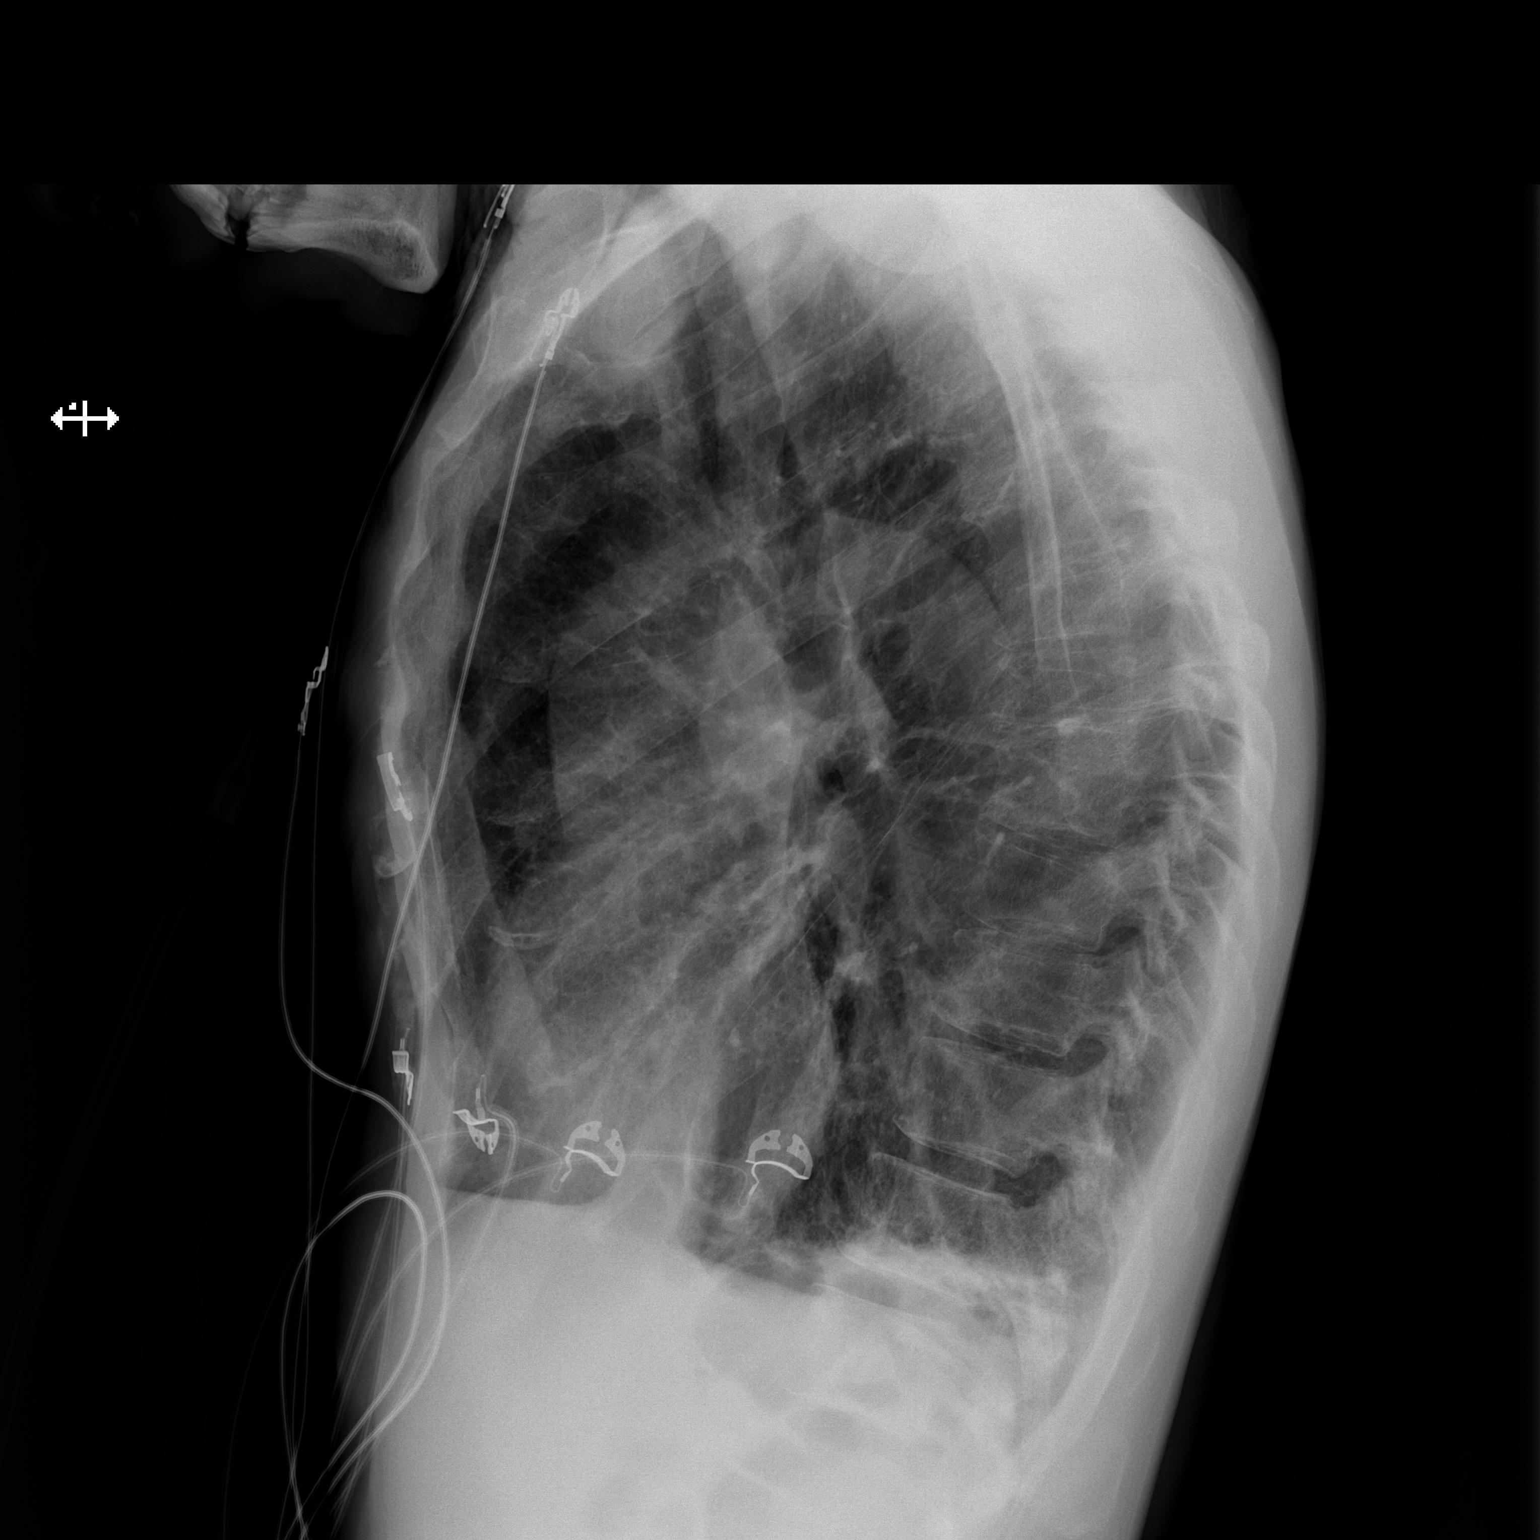

[2 of 2 positions shown; findings below may reference images not displayed]

FINDINGS: The heart size and mediastinal contours are stable. There is chronic
left basilar scarring, similar to the 5853 examination. The
superimposed airspace disease in the lingula on the most recent
study has largely cleared. No new airspace disease or significant
pleural effusion identified. The bones appear unchanged.
IMPRESSION: Partial clearing of previously demonstrated lingular airspace
disease approximately 7 weeks ago. Stable chronic left lower lobe
scarring. No acute findings.

## 2016-10-23 ENCOUNTER — Encounter (HOSPITAL_COMMUNITY): Payer: Self-pay | Admitting: Emergency Medicine

## 2016-10-23 ENCOUNTER — Emergency Department (HOSPITAL_COMMUNITY): Payer: Medicare Other

## 2016-10-23 ENCOUNTER — Inpatient Hospital Stay (HOSPITAL_COMMUNITY)
Admission: EM | Admit: 2016-10-23 | Discharge: 2016-10-25 | DRG: 871 | Disposition: A | Discharge status: Home / Self Care | Payer: Medicare Other | Attending: Family Medicine | Admitting: Family Medicine

## 2016-10-23 DIAGNOSIS — G92 Toxic encephalopathy: Secondary | ICD-10-CM | POA: Diagnosis present

## 2016-10-23 DIAGNOSIS — E860 Dehydration: Secondary | ICD-10-CM | POA: Diagnosis present

## 2016-10-23 DIAGNOSIS — D649 Anemia, unspecified: Secondary | ICD-10-CM | POA: Diagnosis present

## 2016-10-23 DIAGNOSIS — Z23 Encounter for immunization: Secondary | ICD-10-CM

## 2016-10-23 DIAGNOSIS — J9601 Acute respiratory failure with hypoxia: Secondary | ICD-10-CM

## 2016-10-23 DIAGNOSIS — J181 Lobar pneumonia, unspecified organism: Secondary | ICD-10-CM

## 2016-10-23 DIAGNOSIS — E878 Other disorders of electrolyte and fluid balance, not elsewhere classified: Secondary | ICD-10-CM | POA: Diagnosis present

## 2016-10-23 DIAGNOSIS — A419 Sepsis, unspecified organism: Secondary | ICD-10-CM | POA: Diagnosis present

## 2016-10-23 DIAGNOSIS — F17290 Nicotine dependence, other tobacco product, uncomplicated: Secondary | ICD-10-CM | POA: Diagnosis present

## 2016-10-23 DIAGNOSIS — D509 Iron deficiency anemia, unspecified: Secondary | ICD-10-CM | POA: Diagnosis present

## 2016-10-23 DIAGNOSIS — E871 Hypo-osmolality and hyponatremia: Secondary | ICD-10-CM | POA: Diagnosis present

## 2016-10-23 DIAGNOSIS — J189 Pneumonia, unspecified organism: Secondary | ICD-10-CM | POA: Diagnosis present

## 2016-10-23 DIAGNOSIS — J441 Chronic obstructive pulmonary disease with (acute) exacerbation: Secondary | ICD-10-CM | POA: Diagnosis present

## 2016-10-23 DIAGNOSIS — R509 Fever, unspecified: Secondary | ICD-10-CM | POA: Diagnosis not present

## 2016-10-23 HISTORY — DX: Unspecified osteoarthritis, unspecified site: M19.90

## 2016-10-23 LAB — BASIC METABOLIC PANEL
Anion gap: 7 (ref 5–15)
BUN: 8 mg/dL (ref 6–20)
CO2: 27 mmol/L (ref 22–32)
Calcium: 8.4 mg/dL — ABNORMAL LOW (ref 8.9–10.3)
Chloride: 100 mmol/L — ABNORMAL LOW (ref 101–111)
Creatinine, Ser: 0.74 mg/dL (ref 0.61–1.24)
GFR calc Af Amer: >60 mL/min (ref >60)
GFR calc non Af Amer: >60 mL/min (ref >60)
Glucose, Bld: 120 mg/dL — ABNORMAL HIGH (ref 65–99)
Potassium: 3.8 mmol/L (ref 3.5–5.1)
Sodium: 134 mmol/L — ABNORMAL LOW (ref 135–145)

## 2016-10-23 LAB — CBC WITH DIFFERENTIAL/PLATELET
Basophils Absolute: 0 10*3/uL (ref 0.0–0.1)
Basophils Relative: 0 %
Eosinophils Absolute: 0 10*3/uL (ref 0.0–0.7)
Eosinophils Relative: 0 %
HCT: 36.9 % — ABNORMAL LOW (ref 39.0–52.0)
Hemoglobin: 12.5 g/dL — ABNORMAL LOW (ref 13.0–17.0)
Lymphocytes Relative: 8 %
Lymphs Abs: 0.8 10*3/uL (ref 0.7–4.0)
MCH: 32.1 pg (ref 26.0–34.0)
MCHC: 33.9 g/dL (ref 30.0–36.0)
MCV: 94.6 fL (ref 78.0–100.0)
Monocytes Absolute: 0.9 10*3/uL (ref 0.1–1.0)
Monocytes Relative: 9 %
Neutro Abs: 8.3 10*3/uL — ABNORMAL HIGH (ref 1.7–7.7)
Neutrophils Relative %: 83 %
Platelets: 263 10*3/uL (ref 150–400)
RBC: 3.9 MIL/uL — ABNORMAL LOW (ref 4.22–5.81)
RDW: 12.8 % (ref 11.5–15.5)
WBC: 10.1 10*3/uL (ref 4.0–10.5)

## 2016-10-23 LAB — I-STAT CG4 LACTIC ACID, ED: Lactic Acid, Venous: 2.06 mmol/L (ref 0.5–1.9)

## 2016-10-23 LAB — LACTIC ACID, PLASMA
LACTIC ACID, VENOUS: 0.9 mmol/L (ref 0.5–1.9)
Lactic Acid, Venous: 2.2 mmol/L (ref 0.5–1.9)

## 2016-10-23 MED ORDER — OXYCODONE-ACETAMINOPHEN 5-325 MG PO TABS: 1.0000 | ORAL_TABLET | ORAL | Status: DC | PRN

## 2016-10-23 MED ORDER — SODIUM CHLORIDE 0.9 % IV BOLUS (SEPSIS)
1000.0000 mL | Freq: Once | INTRAVENOUS | Status: AC
  Administered 2016-10-23: 1000 mL via INTRAVENOUS

## 2016-10-23 MED ORDER — DEXTROSE 5 % IV SOLN
1.0000 g | INTRAVENOUS | Status: DC
  Administered 2016-10-23 – 2016-10-24 (×2): 1 g via INTRAVENOUS
  Filled 2016-10-23 (×2): qty 10

## 2016-10-23 MED ORDER — ACETAMINOPHEN 325 MG PO TABS: 650.0000 mg | ORAL_TABLET | Freq: Four times a day (QID) | ORAL | Status: DC | PRN

## 2016-10-23 MED ORDER — AZITHROMYCIN 250 MG PO TABS
500.0000 mg | ORAL_TABLET | ORAL | Status: AC
  Administered 2016-10-23 – 2016-10-25 (×3): 500 mg via ORAL
  Filled 2016-10-23 (×3): qty 2

## 2016-10-23 MED ORDER — SODIUM CHLORIDE 0.9 % IV SOLN
INTRAVENOUS | Status: AC
  Administered 2016-10-23: 20:00:00 via INTRAVENOUS

## 2016-10-23 MED ORDER — PNEUMOCOCCAL VAC POLYVALENT 25 MCG/0.5ML IJ INJ
0.5000 mL | INJECTION | INTRAMUSCULAR | Status: AC
  Administered 2016-10-24: 0.5 mL via INTRAMUSCULAR
  Filled 2016-10-23 (×2): qty 0.5

## 2016-10-23 MED ORDER — ONDANSETRON HCL 4 MG/2ML IJ SOLN
4.0000 mg | Freq: Three times a day (TID) | INTRAMUSCULAR | Status: DC | PRN
Start: 2016-10-23 — End: 2016-10-23

## 2016-10-23 MED ORDER — INFLUENZA VAC SPLIT QUAD 0.5 ML IM SUSY
0.5000 mL | PREFILLED_SYRINGE | INTRAMUSCULAR | Status: AC
  Administered 2016-10-24: 0.5 mL via INTRAMUSCULAR

## 2016-10-23 MED ORDER — IPRATROPIUM-ALBUTEROL 0.5-2.5 (3) MG/3ML IN SOLN: 3.0000 mL | RESPIRATORY_TRACT | Status: DC | PRN

## 2016-10-23 MED ORDER — ENOXAPARIN SODIUM 40 MG/0.4ML ~~LOC~~ SOLN
40.0000 mg | SUBCUTANEOUS | Status: DC
  Administered 2016-10-23 – 2016-10-24 (×2): 40 mg via SUBCUTANEOUS
  Filled 2016-10-23 (×2): qty 0.4

## 2016-10-23 MED ORDER — ONDANSETRON HCL 4 MG/2ML IJ SOLN: 4.0000 mg | Freq: Four times a day (QID) | INTRAMUSCULAR | Status: DC | PRN

## 2016-10-23 NOTE — Progress Notes (Signed)
CRITICAL VALUE ALERT  Critical value received:  Lactic acid 2.2  Date of notification:  10/23/16  Time of notification:  2035  Critical value read back: Yes   Nurse who received alert:  Governor SpeckingJerry Smith RN   MD notified (1st page):  MD Opyd   Time of first page: 2040  MD notified (2nd page):  Time of second page:  Responding MD:  MD Opyd   Time MD responded:  2043

## 2016-10-23 NOTE — ED Notes (Signed)
Patient given turkey sandwich and orange juice

## 2016-10-23 NOTE — ED Triage Notes (Signed)
Per EMS, patient has been "complaining of not feeling well for several days." Patient has a productive cough and fever. Patient has hx of pneumonia (reports he gets it every year). Patient received 500 ml NS and 1000 mg of tylenol en route with EMS. Patient was put on 6L [place] with EMS. 99% on 6 L. EMS got a slight CO reading on scene (reading was 4%). Patient is now alert, oriented x4, and ambulatory.

## 2016-10-23 NOTE — Progress Notes (Signed)
Notified MD Opyd that pt came from ED on droplet precaution with no order and no order for flu swab, MD stated to cancel the droplet precaution and no need for flu swab.

## 2016-10-23 NOTE — H&P (Signed)
History and Physical    Devon Wall ZOX:[id number] DOB: Oct 02, 1951 DOA: 10/23/2016  PCP: Gwenyth Bender, MD   Patient coming from: Home  Chief Complaint: Fever, malaise, cough   HPI: Devon Wall is a 65 y.o. male with medical history significant for osteoarthritis and likely COPD who presents to the emergency department with fevers, chills, malaise, confusion, and cough. Patient reports feeling well up until just prior to arrival, but family at the bedside note that he was not feeling well yesterday. He reports subjective fevers and a general malaise along with a cough productive of thick yellow sputum. He denies chest pain or palpitations and denies leg swelling or orthopnea. There has been no recent long distance travel and no personal or family history of VTE. Patient has not tried any interventions for his symptoms prior to presenting to the ED. He denies any recent fall or trauma and denies any use of alcohol or illicit substances. He denies headache or change in vision or hearing, and denies focal numbness or weakness.  ED Course: Upon arrival to the ED, patient is found to be febrile to 38.5 C, saturating 86% on room air, tachycardic in the low 100s, and with vitals otherwise stable. EKG demonstrates sinus rhythm and chest x-ray features a right-sided airspace disease, likely representing a right middle lobe, and possibly right lower lobe pneumonia. Chemistry panel features of mild hyponatremia and hypochloremia and CBC was notable only for a stable normocytic anemia with hemoglobin of 12.5. Lactic acid returns elevated to a value of 2.06. Blood cultures were obtained in the emergency department and the patient was given 1 L of normal saline. Tachycardia resolved with the fluid bolus and his oxygenation normalized with 6 L/m via nasal cannula. His confusion has improved markedly in the ED but he continues to be hypoxic at rest and will be admitted to the telemetry unit for ongoing evaluation  and management of sepsis suspected secondary to community-acquired pneumonia.  Review of Systems:  All other systems reviewed and apart from HPI, are negative.  Past Medical History:  Diagnosis Date  . Arthritis     History reviewed. No pertinent surgical history.   reports that he has been smoking Cigars.  He has never used smokeless tobacco. He reports that he does not drink alcohol or use drugs.  No Known Allergies  History reviewed. No pertinent family history.   Prior to Admission medications   Medication Sig Start Date End Date Taking? Authorizing Provider  oxyCODONE-acetaminophen (PERCOCET/ROXICET) 5-325 MG tablet Take 1 tablet by mouth every 4 (four) hours as needed for severe pain. 01/17/16  Yes Charlynne Pander, MD  albuterol (PROVENTIL HFA;VENTOLIN HFA) 108 (90 BASE) MCG/ACT inhaler Inhale 2 puffs into the lungs every 6 (six) hours as needed for wheezing or shortness of breath. Patient not taking: Reported on 10/23/2016 10/13/14   Eddie North, MD    Physical Exam: Vitals:   10/23/16 1800 10/23/16 1830 10/23/16 1851 10/23/16 1905  BP: 113/78 121/81  109/79  Pulse: 92 89  95  Resp: 20 14  20   Temp:   99.4 F (37.4 C) 99.9 F (37.7 C)  TempSrc:   Oral Oral  SpO2: 97% 98%  94%  Weight:      Height:          Constitutional: Respiratory distress with accessory muscle recruitment, no pallor or cyanosis Eyes: PERTLA, lids and conjunctivae normal ENMT: Mucous membranes are moist. Posterior pharynx clear of any exudate or lesions.   Neck:  normal, supple, no masses, no thyromegaly Respiratory: Mildly diminished bilaterally with rhonchi at right base and scattered wheezes. Increased WOB.  Cardiovascular: Rate ~110 and regular. No extremity edema. No significant JVD. Abdomen: No distension, no tenderness, no masses palpated. Bowel sounds normal.  Musculoskeletal: no clubbing / cyanosis. No joint deformity upper and lower extremities. Normal muscle tone.  Skin: no  significant rashes, lesions, ulcers. Warm, dry, well-perfused. Neurologic: CN 2-12 grossly intact. Sensation intact, DTR normal. Strength 5/5 in all 4 limbs.  Psychiatric: Normal judgment and insight. Alert and oriented x 3. Normal mood and affect.     Labs on Admission: I have personally reviewed following labs and imaging studies  CBC:  Recent Labs Lab 10/23/16 1628  WBC 10.1  NEUTROABS 8.3*  HGB 12.5*  HCT 36.9*  MCV 94.6  PLT 263   Basic Metabolic Panel:  Recent Labs Lab 10/23/16 1628  NA 134*  K 3.8  CL 100*  CO2 27  GLUCOSE 120*  BUN 8  CREATININE 0.74  CALCIUM 8.4*   GFR: Estimated Creatinine Clearance: 95.7 mL/min (by C-G formula based on SCr of 0.74 mg/dL). Liver Function Tests: No results for input(s): AST, ALT, ALKPHOS, BILITOT, PROT, ALBUMIN in the last 168 hours. No results for input(s): LIPASE, AMYLASE in the last 168 hours. No results for input(s): AMMONIA in the last 168 hours. Coagulation Profile: No results for input(s): INR, PROTIME in the last 168 hours. Cardiac Enzymes: No results for input(s): CKTOTAL, CKMB, CKMBINDEX, TROPONINI in the last 168 hours. BNP (last 3 results) No results for input(s): PROBNP in the last 8760 hours. HbA1C: No results for input(s): HGBA1C in the last 72 hours. CBG: No results for input(s): GLUCAP in the last 168 hours. Lipid Profile: No results for input(s): CHOL, HDL, LDLCALC, TRIG, CHOLHDL, LDLDIRECT in the last 72 hours. Thyroid Function Tests: No results for input(s): TSH, T4TOTAL, FREET4, T3FREE, THYROIDAB in the last 72 hours. Anemia Panel: No results for input(s): VITAMINB12, FOLATE, FERRITIN, TIBC, IRON, RETICCTPCT in the last 72 hours. Urine analysis:    Component Value Date/Time   COLORURINE YELLOW 12/31/2015 0747   APPEARANCEUR CLEAR 12/31/2015 0747   LABSPEC 1.022 12/31/2015 0747   PHURINE 6.0 12/31/2015 0747   GLUCOSEU NEGATIVE 12/31/2015 0747   HGBUR NEGATIVE 12/31/2015 0747   BILIRUBINUR  NEGATIVE 12/31/2015 0747   KETONESUR NEGATIVE 12/31/2015 0747   PROTEINUR NEGATIVE 12/31/2015 0747   UROBILINOGEN 1.0 07/23/2013 1624   NITRITE NEGATIVE 12/31/2015 0747   LEUKOCYTESUR NEGATIVE 12/31/2015 0747   Sepsis Labs: @LABRCNTIP (procalcitonin:4,lacticidven:4) )No results found for this or any previous visit (from the past 240 hour(s)).   Radiological Exams on Admission: Dg Chest 2 View  Result Date: 10/23/2016 CLINICAL DATA:  Not feeling well for several days. Productive cough. Shortness of breath. Tobacco use. EXAM: CHEST  2 VIEW COMPARISON:  12/31/2015 FINDINGS: Mild pectus excavatum deformity. Moderate hyperinflation. Patient rotated left on the frontal. Midline trachea. Stable heart size and mediastinal contours. No pleural effusion or pneumothorax. Moderate lower lobe predominant interstitial thickening persists. Patchy right-sided airspace disease on the frontal radiograph is likely in the right middle and possibly right lower lobe on the lateral view. IMPRESSION: Right-sided airspace disease is likely within the right middle and possibly right lower lobes. This is most consistent with infection, superimposed upon moderate chronic pulmonary interstitial thickening. Electronically Signed   By: Jeronimo GreavesKyle  Talbot M.D.   On: 10/23/2016 17:22    EKG: Independently reviewed. Normal sinus rhythm   Assessment/Plan  1. Sepsis secondary  to CAP, acute hypoxic respiratory failure  - Presents with fever, hypoxia, tachycardia, elevated lactate, confusion, and PNA on CXR - He was treated with 1 L NS bolus and blood cultures were obtained in ED  - He will be admitted, given additional IVF and started on abx  - Check sputum gram-stain and culture, strep pneumo urine antigen - Rocephin and azithromycin while awaiting culture data - Monitor with continuous pulse oximetry; wean supplemental O2 as tolerated  - Symptomatic care with APAP prn, nebs   2. Acute encephalopathy   - Rapidly improving in ED  with supplemental O2 and IVF - Likely a toxic-metabolic encephalopathy in setting of sepsis  - Anticipate resolution with treatment of PNA  3. Hyponatremia  - Serum sodium 134 on admission in setting of sepsis with dehydration  - He will be fluid-resuscitated and BMP will be obtained in am   4. Normocytic anemia  - Hgb is 12.5 on admission and stable relative to priors with no sign of bleeding  - Check iron studies, B12, and folate; supplement as indicated     DVT prophylaxis: sq Lovenox  Code Status: Full  Family Communication: Son and friend updated at bedside at patient's request Disposition Plan: Admit to telemetry Consults called: None Admission status: Inpatient    Briscoe Deutscherimothy S Rissa Turley, MD [facility] Pager [phone]  If 7PM-7AM, please contact night-coverage [email or web address] Password [email or web address]  10/23/2016, 7:12 PM

## 2016-10-23 NOTE — ED Notes (Signed)
Patient transported to X-ray 

## 2016-10-23 NOTE — ED Provider Notes (Signed)
[facility]-EMERGENCY DEPT Provider Note   CSN: [id number] Arrival date & time: 10/23/16  1552     History   Chief Complaint Chief Complaint  Patient presents with  . Fever  . Cough    HPI Devon Wall is a 65 y.o. male who presents with a fever and cough. He also reports shortness of breath. Patient is a poor historian or is confused and is unable to say how long he has been coughing. The cough is productive. His family member reported that he talked to him yesterday and feels that he was not confused and seemingly well. Per EMS, patient had a temperature of 103 on the scene. Patient was put on 6 L of oxygen en route. Patient is not on oxygen at home. Patient denies any chest pain, abdominal pain, nausea, vomiting, urinary symptoms.   Fever   Associated symptoms include cough. Pertinent negatives include no chest pain, no vomiting, no headaches and no sore throat.  Cough  Associated symptoms include shortness of breath. Pertinent negatives include no chest pain, no chills, no headaches and no sore throat.    Past Medical History:  Diagnosis Date  . Arthritis     Patient Active Problem List   Diagnosis Date Noted  . Pneumonia 10/23/2016  . CAP (community acquired pneumonia) 10/23/2016  . Hyponatremia 10/23/2016  . Normocytic anemia 10/23/2016  . Malnutrition of moderate degree 11/12/2015  . Lobar pneumonia, unspecified organism (HCC) 11/11/2015  . Leukocytosis 11/10/2015  . Sepsis due to pneumonia (HCC) 11/10/2015  . Acute respiratory failure with hypoxia (HCC) 11/10/2015  . Underweight 11/10/2015  . Protein-calorie malnutrition, severe (HCC) 10/11/2014    History reviewed. No pertinent surgical history.     Home Medications    Prior to Admission medications   Medication Sig Start Date End Date Taking? Authorizing Provider  oxyCODONE-acetaminophen (PERCOCET/ROXICET) 5-325 MG tablet Take 1 tablet by mouth every 4 (four) hours as needed for severe pain. 01/17/16  Yes  Charlynne Pander, MD  albuterol (PROVENTIL HFA;VENTOLIN HFA) 108 (90 BASE) MCG/ACT inhaler Inhale 2 puffs into the lungs every 6 (six) hours as needed for wheezing or shortness of breath. Patient not taking: Reported on 10/23/2016 10/13/14   Eddie North, MD    Family History Family History  Problem Relation Age of Onset  . Hypertension Other     Social History Social History  Substance Use Topics  . Smoking status: Light Tobacco Smoker    Types: Cigars  . Smokeless tobacco: Never Used  . Alcohol use No     Allergies   Patient has no known allergies.   Review of Systems Review of Systems  Constitutional: Negative for chills and fever.  HENT: Negative for facial swelling and sore throat.   Respiratory: Positive for cough and shortness of breath.   Cardiovascular: Negative for chest pain.  Gastrointestinal: Negative for abdominal pain, nausea and vomiting.  Genitourinary: Negative for dysuria.  Musculoskeletal: Negative for back pain.  Skin: Negative for rash and wound.  Neurological: Negative for headaches.  Psychiatric/Behavioral: Positive for confusion. The patient is not nervous/anxious.      Physical Exam Updated Vital Signs BP 109/79 (BP Location: Right Arm)   Pulse 95   Temp 99.9 F (37.7 C) (Oral)   Resp 20   Ht 6\' 2"  (1.88 m)   Wt 73.5 kg   SpO2 94%   BMI 20.80 kg/m   Physical Exam  Constitutional: He appears well-developed and well-nourished. No distress.  Patient seems to be confused  HENT:  Head: Normocephalic and atraumatic.  Mouth/Throat: Oropharynx is clear and moist. No oropharyngeal exudate.  Eyes: Conjunctivae are normal. Pupils are equal, round, and reactive to light. Right eye exhibits no discharge. Left eye exhibits no discharge. No scleral icterus.  Neck: Normal range of motion. Neck supple. No thyromegaly present.  Cardiovascular: Normal rate, regular rhythm, normal heart sounds and intact distal pulses.  Exam reveals no gallop and  no friction rub.   No murmur heard. Pulmonary/Chest: Effort normal and breath sounds normal. No stridor. No respiratory distress. He has no wheezes. He has no rales.  Abdominal: Soft. Bowel sounds are normal. He exhibits no distension. There is no tenderness. There is no rebound and no guarding.  Musculoskeletal: He exhibits no edema.  Lymphadenopathy:    He has no cervical adenopathy.  Neurological: He is alert. Coordination normal.  Skin: Skin is warm and dry. No rash noted. He is not diaphoretic. No pallor.  Psychiatric: He has a normal mood and affect.  Nursing note and vitals reviewed.    ED Treatments / Results  Labs (all labs ordered are listed, but only abnormal results are displayed) Labs Reviewed  BASIC METABOLIC PANEL - Abnormal; Notable for the following:       Result Value   Sodium 134 (*)    Chloride 100 (*)    Glucose, Bld 120 (*)    Calcium 8.4 (*)    All other components within normal limits  CBC WITH DIFFERENTIAL/PLATELET - Abnormal; Notable for the following:    RBC 3.90 (*)    Hemoglobin 12.5 (*)    HCT 36.9 (*)    Neutro Abs 8.3 (*)    All other components within normal limits  I-STAT CG4 LACTIC ACID, ED - Abnormal; Notable for the following:    Lactic Acid, Venous 2.06 (*)    All other components within normal limits  CULTURE, BLOOD (ROUTINE X 2)  CULTURE, BLOOD (ROUTINE X 2)  CULTURE, EXPECTORATED SPUTUM-ASSESSMENT  GRAM STAIN  HIV ANTIBODY (ROUTINE TESTING)  STREP PNEUMONIAE URINARY ANTIGEN  CBC WITH DIFFERENTIAL/PLATELET  BASIC METABOLIC PANEL  LACTIC ACID, PLASMA  LACTIC ACID, PLASMA    EKG  EKG Interpretation  Date/Time:  Sunday October 23 2016 16:28:17 EST Ventricular Rate:  97 PR Interval:    QRS Duration: 81 QT Interval:  327 QTC Calculation: 416 R Axis:   68 Text Interpretation:  Sinus rhythm Anterior infarct, old No significant change since last tracing Confirmed by Rubin PayorPICKERING  MD, NATHAN [phone]([id number]) on 10/23/2016 4:38:01 PM        Radiology Dg Chest 2 View  Result Date: 10/23/2016 CLINICAL DATA:  Not feeling well for several days. Productive cough. Shortness of breath. Tobacco use. EXAM: CHEST  2 VIEW COMPARISON:  12/31/2015 FINDINGS: Mild pectus excavatum deformity. Moderate hyperinflation. Patient rotated left on the frontal. Midline trachea. Stable heart size and mediastinal contours. No pleural effusion or pneumothorax. Moderate lower lobe predominant interstitial thickening persists. Patchy right-sided airspace disease on the frontal radiograph is likely in the right middle and possibly right lower lobe on the lateral view. IMPRESSION: Right-sided airspace disease is likely within the right middle and possibly right lower lobes. This is most consistent with infection, superimposed upon moderate chronic pulmonary interstitial thickening. Electronically Signed   By: Jeronimo GreavesKyle  Talbot M.D.   On: 10/23/2016 17:22    Procedures Procedures (including critical care time)  Medications Ordered in ED Medications  oxyCODONE-acetaminophen (PERCOCET/ROXICET) 5-325 MG per tablet 1 tablet (not administered)  enoxaparin (LOVENOX)  injection 40 mg (not administered)  0.9 %  sodium chloride infusion (not administered)  cefTRIAXone (ROCEPHIN) 1 g in dextrose 5 % 50 mL IVPB (not administered)  azithromycin (ZITHROMAX) tablet 500 mg (not administered)  acetaminophen (TYLENOL) tablet 650 mg (not administered)  ondansetron (ZOFRAN) injection 4 mg (not administered)  ipratropium-albuterol (DUONEB) 0.5-2.5 (3) MG/3ML nebulizer solution 3 mL (not administered)  sodium chloride 0.9 % bolus 1,000 mL (1,000 mLs Intravenous New Bag/Given 10/23/16 1719)     Initial Impression / Assessment and Plan / ED Course  I have reviewed the triage vital signs and the nursing notes.  Pertinent labs & imaging results that were available during my care of the patient were reviewed by me and considered in my medical decision making (see chart for  details).  Clinical Course     Patient with pneumonia and right middle lobe. CBC shows hemoglobin 12.5. BMP shows sodium 134, chloride 100, glucose 120, calcium 8.4. Lactate 2.06. Blood cultures pending. CXR shows right-sided airspace disease is likely within the right middle and possibly right lower lobes most consistent with infection, superimposed upon moderate chronic pulmonary interstitial thickening. EKG shows NSR with no significant change since last tracing. I spoke with hospitalist, Dr. Antionette Charpyd, who will admit the patient to telemetry. Rocephin and Zithromax initiated. Patient also evaluated by Dr. Rubin PayorPickering who guided the patient's management agrees with plan.  Final Clinical Impressions(s) / ED Diagnoses   Final diagnoses:  Community acquired pneumonia of right middle lobe of lung [facility](HCC)    New Prescriptions Current Discharge Medication List       Emi Holeslexandra M Sahian Kerney, Cordelia Poche-C 10/23/16 1934    Benjiman CoreNathan Pickering, MD 10/24/16 0110

## 2016-10-23 NOTE — ED Notes (Signed)
Bed: [id number] Expected date:  Expected time:  Means of arrival:  Comments: 65 yo cough, fever, low O2

## 2016-10-23 NOTE — ED Notes (Signed)
Lactic Acid= 2.06, PA Alexandra notified

## 2016-10-24 LAB — EXPECTORATED SPUTUM ASSESSMENT W REFEX TO RESP CULTURE

## 2016-10-24 LAB — BASIC METABOLIC PANEL
Anion gap: 6 (ref 5–15)
BUN: 7 mg/dL (ref 6–20)
CHLORIDE: 105 mmol/L (ref 101–111)
CO2: 26 mmol/L (ref 22–32)
CREATININE: 0.75 mg/dL (ref 0.61–1.24)
Calcium: 8.4 mg/dL — ABNORMAL LOW (ref 8.9–10.3)
GFR calc Af Amer: >60 mL/min (ref >60)
GFR calc non Af Amer: >60 mL/min (ref >60)
GLUCOSE: 103 mg/dL — AB (ref 65–99)
POTASSIUM: 4.2 mmol/L (ref 3.5–5.1)
SODIUM: 137 mmol/L (ref 135–145)

## 2016-10-24 LAB — CBC WITH DIFFERENTIAL/PLATELET
Basophils Absolute: 0 10*3/uL (ref 0.0–0.1)
Basophils Relative: 0 %
EOS ABS: 0.1 10*3/uL (ref 0.0–0.7)
EOS PCT: 0 %
HCT: 34.1 % — ABNORMAL LOW (ref 39.0–52.0)
HEMOGLOBIN: 11.3 g/dL — AB (ref 13.0–17.0)
LYMPHS ABS: 2.5 10*3/uL (ref 0.7–4.0)
LYMPHS PCT: 14 %
MCH: 31.3 pg (ref 26.0–34.0)
MCHC: 33.1 g/dL (ref 30.0–36.0)
MCV: 94.5 fL (ref 78.0–100.0)
MONOS PCT: 9 %
Monocytes Absolute: 1.6 10*3/uL — ABNORMAL HIGH (ref 0.1–1.0)
Neutro Abs: 14 10*3/uL — ABNORMAL HIGH (ref 1.7–7.7)
Neutrophils Relative %: 77 %
PLATELETS: 247 10*3/uL (ref 150–400)
RBC: 3.61 MIL/uL — ABNORMAL LOW (ref 4.22–5.81)
RDW: 12.8 % (ref 11.5–15.5)
WBC: 18.2 10*3/uL — ABNORMAL HIGH (ref 4.0–10.5)

## 2016-10-24 LAB — EXPECTORATED SPUTUM ASSESSMENT W GRAM STAIN, RFLX TO RESP C

## 2016-10-24 LAB — STREP PNEUMONIAE URINARY ANTIGEN: Strep Pneumo Urinary Antigen: NEGATIVE

## 2016-10-24 LAB — HIV ANTIBODY (ROUTINE TESTING W REFLEX): HIV SCREEN 4TH GENERATION: NONREACTIVE

## 2016-10-24 NOTE — Care Management Note (Signed)
Case Management Note  Patient Details  Name: Devon CohoJames Standifer MRN: [id number] Date of Birth: 09/07/1951  Subjective/Objective: 65 y/o m admitted w/Sepsis. From home.                   Action/Plan:d/c plan home.   Expected Discharge Date:                  Expected Discharge Plan:  Home/Self Care  In-House Referral:     Discharge planning Services  CM Consult  Post Acute Care Choice:    Choice offered to:     DME Arranged:    DME Agency:     HH Arranged:    HH Agency:     Status of Service:  In process, will continue to follow  If discussed at Long Length of Stay Meetings, dates discussed:    Additional Comments:  Lanier ClamMahabir, Deandre Stansel, RN 10/24/2016, 2:52 PM

## 2016-10-24 NOTE — Progress Notes (Signed)
PROGRESS NOTE [facility]   Devon CohoJames Bologna   QIO:[id number] DOB: 08/07/1951  DOA: 10/23/2016 PCP: Gwenyth BenderEric L Dean, MD   Brief Narrative:  Devon Wall is a 65 y.o. male with medical history significant for osteoarthritis presented to the ED with fever, chills, malaise, cough and confusion. Was found to be afebrile and desaturating with tachycardia and increase heart rate. Patient was admitted for sepsis due to pneumonia started on IV fluids and antibiotics.   Subjective: Patient seen and examined this morning. Patient reported he doesn't remember what brought him to the hospital. His been coughing up for a while. He recall having a fever and feeling malaise yesterday. Today he feels back to normal. Denies chest pain, shortness of breath, palpitation or dizziness. No acute events overnight. No other complaints   Assessment & Plan: 1. Sepsis secondary to CAP, acute hypoxic respiratory failure -  sepsis improved now vital signs normal, responding to fluid resuscitation and antibiotics -Initially with with fever, hypoxia, tachycardia, elevated lactate, confusion, and PNA on CXR -Sputum culture pending -Continue Rocephin and azithromycin -Wean oxygen as tolerated -Nebulizers when necessary  2. Acute encephalopathy - resolved - secondary to acute infection and sepsis    3. Hyponatremia - resolved with IV hydration - Serum sodium 134 on admission in setting of sepsis with dehydration   4. Normocytic anemia -  slight down from admission likely delusional - Hgb is 12.5 on admission and stable relative to priors with no sign of bleeding  -Anemia panel  -Monitor CBC    DVT prophylaxis: sq Lovenox  Code Status: Full  Family Communication:  none at bedside Disposition Plan:  anticipate discharge when medically improving possible in 48 hours  Consultants:   None  Procedures:   None  Antimicrobials:   Ceftriaxone 11/5  Azithromycin 11/5   Objective: Vitals:   10/23/16  1905 10/24/16 0534 10/24/16 1041 10/24/16 1301  BP: 109/79 (!) 143/93  128/77  Pulse: 95 94  88  Resp: 20 18  18   Temp: 99.9 F (37.7 C) 99.4 F (37.4 C)  98.7 F (37.1 C)  TempSrc: Oral Oral  Oral  SpO2: 94% 100% 96% 98%  Weight:      Height:        Intake/Output Summary (Last 24 hours) at 10/24/16 1516 Last data filed at 10/24/16 1301  Gross per 24 hour  Intake             1828 ml  Output             2200 ml  Net             -372 ml   Filed Weights   10/23/16 1605  Weight: 73.5 kg (162 lb)    Examination:  General exam: Appears calm and comfortable, thin HEENT: AC/AT, PERRLA, OP moist and clear, poor dentition Respiratory system: Good air entry. Mild rhonchi in the right middle and lower lobe Cardiovascular system: S1 & S2 heard, RRR. No JVD, murmurs, rubs or gallops Gastrointestinal system: Abdomen is nondistended, soft and nontender.  Normal bowel sounds heard. Central nervous system: Alert and oriented. No focal neurological deficits. Extremities: No pedal edema. Symmetric, strength 5/5   Skin: No rashes, lesions or ulcers Psychiatry: Judgement and insight appear normal. Mood & affect flat   Data Reviewed: I have personally reviewed following labs and imaging studies  CBC:  Recent Labs Lab 10/23/16 1628 10/24/16 0513  WBC 10.1 18.2*  NEUTROABS 8.3* 14.0*  HGB 12.5* 11.3*  HCT 36.9*  34.1*  MCV 94.6 94.5  PLT 263 247   Basic Metabolic Panel:  Recent Labs Lab 10/23/16 1628 10/24/16 0513  NA 134* 137  K 3.8 4.2  CL 100* 105  CO2 27 26  GLUCOSE 120* 103*  BUN 8 7  CREATININE 0.74 0.75  CALCIUM 8.4* 8.4*   GFR: Estimated Creatinine Clearance: 95.7 mL/min (by C-G formula based on SCr of 0.75 mg/dL). Liver Function Tests: No results for input(s): AST, ALT, ALKPHOS, BILITOT, PROT, ALBUMIN in the last 168 hours. No results for input(s): LIPASE, AMYLASE in the last 168 hours. No results for input(s): AMMONIA in the last 168 hours. Coagulation  Profile: No results for input(s): INR, PROTIME in the last 168 hours. Cardiac Enzymes: No results for input(s): CKTOTAL, CKMB, CKMBINDEX, TROPONINI in the last 168 hours. BNP (last 3 results) No results for input(s): PROBNP in the last 8760 hours. HbA1C: No results for input(s): HGBA1C in the last 72 hours. CBG: No results for input(s): GLUCAP in the last 168 hours. Lipid Profile: No results for input(s): CHOL, HDL, LDLCALC, TRIG, CHOLHDL, LDLDIRECT in the last 72 hours. Thyroid Function Tests: No results for input(s): TSH, T4TOTAL, FREET4, T3FREE, THYROIDAB in the last 72 hours. Anemia Panel: No results for input(s): VITAMINB12, FOLATE, FERRITIN, TIBC, IRON, RETICCTPCT in the last 72 hours. Sepsis Labs:  Recent Labs Lab 10/23/16 1643 10/23/16 1951 10/23/16 2316  LATICACIDVEN 2.06* 2.2* 0.9    Recent Results (from the past 240 hour(s))  Culture, sputum-assessment     Status: None   Collection Time: 10/24/16 10:41 AM  Result Value Ref Range Status   Specimen Description SPUTUM  Final   Special Requests NONE  Final   Sputum evaluation   Final    THIS SPECIMEN IS ACCEPTABLE. RESPIRATORY CULTURE REPORT TO FOLLOW.   Report Status 10/24/2016 FINAL  Final  Culture, respiratory (NON-Expectorated)     Status: None (Preliminary result)   Collection Time: 10/24/16 10:41 AM  Result Value Ref Range Status   Specimen Description SPUTUM  Final   Special Requests NONE  Final   Gram Stain   Final    ABUNDANT WBC PRESENT, PREDOMINANTLY PMN MODERATE GRAM POSITIVE COCCI IN PAIRS FEW GRAM POSITIVE COCCI IN CLUSTERS MODERATE GRAM NEGATIVE RODS [facility]formed [facility]    Culture PENDING  Incomplete   Report Status PENDING  Incomplete      Radiology Studies: Dg Chest 2 View  Result Date: 10/23/2016 CLINICAL DATA:  Not feeling well for several days. Productive cough. Shortness of breath. Tobacco use. EXAM: CHEST  2 VIEW COMPARISON:  12/31/2015 FINDINGS: Mild pectus excavatum  deformity. Moderate hyperinflation. Patient rotated left on the frontal. Midline trachea. Stable heart size and mediastinal contours. No pleural effusion or pneumothorax. Moderate lower lobe predominant interstitial thickening persists. Patchy right-sided airspace disease on the frontal radiograph is likely in the right middle and possibly right lower lobe on the lateral view. IMPRESSION: Right-sided airspace disease is likely within the right middle and possibly right lower lobes. This is most consistent with infection, superimposed upon moderate chronic pulmonary interstitial thickening. Electronically Signed   By: Jeronimo GreavesKyle  Talbot M.D.   On: 10/23/2016 17:22     Scheduled Meds: . azithromycin  500 mg Oral Q24H  . cefTRIAXone (ROCEPHIN)  IV  1 g Intravenous Q24H  . enoxaparin (LOVENOX) injection  40 mg Subcutaneous Q24H   Continuous Infusions:   LOS: 1 day    Latrelle DodrillEdwin Silva, MD [facility] Pager [phone]  If 7PM-7AM, please  contact night-coverage [email or web address] Password [email or web address] 10/24/2016, 3:16 PM

## 2016-10-25 DIAGNOSIS — D509 Iron deficiency anemia, unspecified: Secondary | ICD-10-CM

## 2016-10-25 LAB — CBC
HCT: 34.4 % — ABNORMAL LOW (ref 39.0–52.0)
Hemoglobin: 11.7 g/dL — ABNORMAL LOW (ref 13.0–17.0)
MCH: 32.2 pg (ref 26.0–34.0)
MCHC: 34 g/dL (ref 30.0–36.0)
MCV: 94.8 fL (ref 78.0–100.0)
PLATELETS: 263 10*3/uL (ref 150–400)
RBC: 3.63 MIL/uL — AB (ref 4.22–5.81)
RDW: 12.9 % (ref 11.5–15.5)
WBC: 11.4 10*3/uL — AB (ref 4.0–10.5)

## 2016-10-25 LAB — BASIC METABOLIC PANEL
Anion gap: 6 (ref 5–15)
BUN: 8 mg/dL (ref 6–20)
CHLORIDE: 106 mmol/L (ref 101–111)
CO2: 27 mmol/L (ref 22–32)
CREATININE: 0.77 mg/dL (ref 0.61–1.24)
Calcium: 8.5 mg/dL — ABNORMAL LOW (ref 8.9–10.3)
Glucose, Bld: 97 mg/dL (ref 65–99)
POTASSIUM: 4.1 mmol/L (ref 3.5–5.1)
SODIUM: 139 mmol/L (ref 135–145)

## 2016-10-25 LAB — IRON AND TIBC
IRON: 28 ug/dL — AB (ref 45–182)
Saturation Ratios: 13 % — ABNORMAL LOW (ref 17.9–39.5)
TIBC: 214 ug/dL — AB (ref 250–450)
UIBC: 186 ug/dL

## 2016-10-25 LAB — RETICULOCYTES
RBC.: 3.63 MIL/uL — ABNORMAL LOW (ref 4.22–5.81)
RETIC COUNT ABSOLUTE: 54.5 10*3/uL (ref 19.0–186.0)
RETIC CT PCT: 1.5 % (ref 0.4–3.1)

## 2016-10-25 LAB — VITAMIN B12: VITAMIN B 12: 674 pg/mL (ref 180–914)

## 2016-10-25 LAB — FOLATE: Folate: 12.5 ng/mL (ref >5.9)

## 2016-10-25 LAB — FERRITIN: Ferritin: 112 ng/mL (ref 24–336)

## 2016-10-25 MED ORDER — CEFUROXIME AXETIL 500 MG PO TABS: 500.0000 mg | ORAL_TABLET | Freq: Two times a day (BID) | ORAL | 0 refills | Status: AC

## 2016-10-25 MED ORDER — CEFUROXIME AXETIL 500 MG PO TABS
500.0000 mg | ORAL_TABLET | Freq: Two times a day (BID) | ORAL | Status: DC
  Filled 2016-10-25: qty 1

## 2016-10-25 NOTE — Discharge Summary (Signed)
Physician Discharge Summary  Devon Wall  ZOX:[id number]  DOB: Apr 07, 1951  DOA: 10/23/2016 PCP: Gwenyth Bender, MD  Admit date: 10/23/2016 Discharge date: 10/25/2016  Admitted From: Home  Disposition:  Home  Recommendations for Outpatient Follow-up:  1. Follow up with PCP in 1-2 weeks 2. Please obtain BMP/CBC in one week 3. Please follow up on the following pending results: Sputum cultures, Blood Cultures  Home Health: None Equipment/Devices: None  Discharge Condition: Stable CODE STATUS: Full Diet recommendation: Heart Healthy   Brief/Interim Summary: Devon Wall a 65 y.o.malewith medical history significant for osteoarthritis presented to the ED with fever, chills, malaise, cough and confusion. Was found to be afebrile and desaturating with tachycardia and increase heart rate. Patient was admitted for sepsis due to pneumonia started on IV fluids and antibiotics. Patient was back to baseline prior to get to medical floor. Has significantly improved with fluids and abx. Remained afebrile during the hospital stay, WBC trended down, and clinically patient doing much better. Patient will be discharge home to complete a 10 days course of abx, and follow up with PMD.   Subjective: Patient seen and examined with interdisciplinary team. Patient report feeling back to baseline. Have no complaints today. No acute events overnight.   Discharge Diagnoses:   1. Sepsis secondary to CAP, acute hypoxic respiratory failure -  sepsis resolved responded to fluid resuscitation and antibiotics - Initially with with fever, hypoxia, tachycardia, elevated lactate, confusion, and PNA on CXR -Sputum culture pending -Blood cx no growth UTD - need to follow final results  -Last dose of Azithromycin today  -Will send Ceftin 500 mg BID for 10 days -Check xray in 2 week to ensure resolution -Albuterol PRN   2. Acute encephalopathy - resolved - secondary to acute infection and sepsis   3.  Hyponatremia Resolved  with IV hydration - Serum sodium 134 on admission in setting of sepsis with dehydration   4. Normocytic anemia -  slight down from admission likely delusional, also found to have iron deficiency  - Hgb is 12.5 on admission and stable relative to priors with no sign of bleeding  -Monitor CBC in 1 week  -Increase iron in diet advised   Discharge Instructions  Discharge Instructions    Call MD for:  difficulty breathing, headache or visual disturbances    Complete by:  As directed    Call MD for:  extreme fatigue    Complete by:  As directed    Call MD for:  hives    Complete by:  As directed    Call MD for:  persistant dizziness or light-headedness    Complete by:  As directed    Call MD for:  persistant nausea and vomiting    Complete by:  As directed    Call MD for:  redness, tenderness, or signs of infection (pain, swelling, redness, odor or green/yellow discharge around incision site)    Complete by:  As directed    Call MD for:  severe uncontrolled pain    Complete by:  As directed    Call MD for:  temperature >100.4    Complete by:  As directed    Diet - low sodium heart healthy    Complete by:  As directed    Discharge instructions    Complete by:  As directed    Increase activity slowly    Complete by:  As directed        Medication List    TAKE these medications  albuterol 108 (90 Base) MCG/ACT inhaler Commonly known as:  PROVENTIL HFA;VENTOLIN HFA Inhale 2 puffs into the lungs every 6 (six) hours as needed for wheezing or shortness of breath.   cefUROXime 500 MG tablet Commonly known as:  CEFTIN Take 1 tablet (500 mg total) by mouth 2 (two) times daily with a meal.   oxyCODONE-acetaminophen 5-325 MG tablet Commonly known as:  PERCOCET/ROXICET Take 1 tablet by mouth every 4 (four) hours as needed for severe pain.      Follow-up Information    Gwenyth Bender, MD. Schedule an appointment as soon as possible for a visit in 1 week(s).    Specialty:  Internal Medicine Contact information: [address] [place] [address] [phone]          No Known Allergies  Consultations:  None   Procedures/Studies: Dg Chest 2 View  Result Date: 10/23/2016 CLINICAL DATA:  Not feeling well for several days. Productive cough. Shortness of breath. Tobacco use. EXAM: CHEST  2 VIEW COMPARISON:  12/31/2015 FINDINGS: Mild pectus excavatum deformity. Moderate hyperinflation. Patient rotated left on the frontal. Midline trachea. Stable heart size and mediastinal contours. No pleural effusion or pneumothorax. Moderate lower lobe predominant interstitial thickening persists. Patchy right-sided airspace disease on the frontal radiograph is likely in the right middle and possibly right lower lobe on the lateral view. IMPRESSION: Right-sided airspace disease is likely within the right middle and possibly right lower lobes. This is most consistent with infection, superimposed upon moderate chronic pulmonary interstitial thickening. Electronically Signed   By: Jeronimo Greaves M.D.   On: 10/23/2016 17:22      Discharge Exam: Vitals:   10/25/16 0900 10/25/16 1337  BP: 119/82 123/78  Pulse: 63 75  Resp: 17 18  Temp: 98.5 F (36.9 C) 98.7 F (37.1 C)   Vitals:   10/24/16 2138 10/25/16 0618 10/25/16 0900 10/25/16 1337  BP: 122/76 (!) 126/91 119/82 123/78  Pulse: 73 80 63 75  Resp: 18 18 17 18   Temp: 99.3 F (37.4 C) 99 F (37.2 C) 98.5 F (36.9 C) 98.7 F (37.1 C)  TempSrc: Oral Tympanic Oral Oral  SpO2: 95% 96% 98% 96%  Weight:      Height:        General: Pt is alert, awake, not in acute distress Cardiovascular: RRR, S1/S2 +, no rubs, no gallops Respiratory: CTA bilaterally, no wheezing, no rhonchi Abdominal: Soft, NT, ND, bowel sounds + Extremities: no edema, no cyanosis    The results of significant diagnostics from this hospitalization (including imaging, microbiology, ancillary and laboratory) are listed  below for reference.     Microbiology: Recent Results (from the past 240 hour(s))  Culture, blood (Routine X 2) w Reflex to ID Panel     Status: None (Preliminary result)   Collection Time: 10/23/16  5:08 PM  Result Value Ref Range Status   Specimen Description BLOOD BLOOD LEFT FOREARM  Final   Special Requests BOTTLES DRAWN AEROBIC AND ANAEROBIC 5 CC EACH  Final   Culture   Final    NO GROWTH 1 DAY Performed at [facility]    Report Status PENDING  Incomplete  Culture, blood (Routine X 2) w Reflex to ID Panel     Status: None (Preliminary result)   Collection Time: 10/23/16  5:14 PM  Result Value Ref Range Status   Specimen Description BLOOD BLOOD LEFT FOREARM  Final   Special Requests BOTTLES DRAWN AEROBIC AND ANAEROBIC 5 CC EACH  Final  Culture   Final    NO GROWTH 1 DAY Performed at [facility]    Report Status PENDING  Incomplete  Culture, sputum-assessment     Status: None   Collection Time: 10/24/16 10:41 AM  Result Value Ref Range Status   Specimen Description SPUTUM  Final   Special Requests NONE  Final   Sputum evaluation   Final    THIS SPECIMEN IS ACCEPTABLE. RESPIRATORY CULTURE REPORT TO FOLLOW.   Report Status 10/24/2016 FINAL  Final  Culture, respiratory (NON-Expectorated)     Status: None (Preliminary result)   Collection Time: 10/24/16 10:41 AM  Result Value Ref Range Status   Specimen Description SPUTUM  Final   Special Requests NONE  Final   Gram Stain   Final    ABUNDANT WBC PRESENT, PREDOMINANTLY PMN MODERATE GRAM POSITIVE COCCI IN PAIRS FEW GRAM POSITIVE COCCI IN CLUSTERS MODERATE GRAM NEGATIVE RODS    Culture   Final    CULTURE REINCUBATED FOR BETTER GROWTH Performed at [facility]    Report Status PENDING  Incomplete     Labs: BNP (last 3 results) No results for input(s): BNP in the last 8760 hours. Basic Metabolic Panel:  Recent Labs Lab 10/23/16 1628 10/24/16 0513 10/25/16 0457  NA 134* 137 139  K 3.8 4.2  4.1  CL 100* 105 106  CO2 27 26 27   GLUCOSE 120* 103* 97  BUN 8 7 8   CREATININE 0.74 0.75 0.77  CALCIUM 8.4* 8.4* 8.5*   Liver Function Tests: No results for input(s): AST, ALT, ALKPHOS, BILITOT, PROT, ALBUMIN in the last 168 hours. No results for input(s): LIPASE, AMYLASE in the last 168 hours. No results for input(s): AMMONIA in the last 168 hours. CBC:  Recent Labs Lab 10/23/16 1628 10/24/16 0513 10/25/16 0457  WBC 10.1 18.2* 11.4*  NEUTROABS 8.3* 14.0*  --   HGB 12.5* 11.3* 11.7*  HCT 36.9* 34.1* 34.4*  MCV 94.6 94.5 94.8  PLT 263 247 263   Cardiac Enzymes: No results for input(s): CKTOTAL, CKMB, CKMBINDEX, TROPONINI in the last 168 hours. BNP: Invalid input(s): POCBNP CBG: No results for input(s): GLUCAP in the last 168 hours. D-Dimer No results for input(s): DDIMER in the last 72 hours. Hgb A1c No results for input(s): HGBA1C in the last 72 hours. Lipid Profile No results for input(s): CHOL, HDL, LDLCALC, TRIG, CHOLHDL, LDLDIRECT in the last 72 hours. Thyroid function studies No results for input(s): TSH, T4TOTAL, T3FREE, THYROIDAB in the last 72 hours.  Invalid input(s): FREET3 Anemia work up  Recent Labs  10/25/16 0457  VITAMINB12 674  FOLATE 12.5  FERRITIN 112  TIBC 214*  IRON 28*  RETICCTPCT 1.5   Urinalysis    Component Value Date/Time   COLORURINE YELLOW 12/31/2015 0747   APPEARANCEUR CLEAR 12/31/2015 0747   LABSPEC 1.022 12/31/2015 0747   PHURINE 6.0 12/31/2015 0747   GLUCOSEU NEGATIVE 12/31/2015 0747   HGBUR NEGATIVE 12/31/2015 0747   BILIRUBINUR NEGATIVE 12/31/2015 0747   KETONESUR NEGATIVE 12/31/2015 0747   PROTEINUR NEGATIVE 12/31/2015 0747   UROBILINOGEN 1.0 07/23/2013 1624   NITRITE NEGATIVE 12/31/2015 0747   LEUKOCYTESUR NEGATIVE 12/31/2015 0747   Sepsis Labs Invalid input(s): PROCALCITONIN,  WBC,  LACTICIDVEN Microbiology Recent Results (from the past 240 hour(s))  Culture, blood (Routine X 2) w Reflex to ID Panel      Status: None (Preliminary result)   Collection Time: 10/23/16  5:08 PM  Result Value Ref Range Status   Specimen Description BLOOD  BLOOD LEFT FOREARM  Final   Special Requests BOTTLES DRAWN AEROBIC AND ANAEROBIC 5 CC EACH  Final   Culture   Final    NO GROWTH 1 DAY Performed at [facility]    Report Status PENDING  Incomplete  Culture, blood (Routine X 2) w Reflex to ID Panel     Status: None (Preliminary result)   Collection Time: 10/23/16  5:14 PM  Result Value Ref Range Status   Specimen Description BLOOD BLOOD LEFT FOREARM  Final   Special Requests BOTTLES DRAWN AEROBIC AND ANAEROBIC 5 CC EACH  Final   Culture   Final    NO GROWTH 1 DAY Performed at [facility]    Report Status PENDING  Incomplete  Culture, sputum-assessment     Status: None   Collection Time: 10/24/16 10:41 AM  Result Value Ref Range Status   Specimen Description SPUTUM  Final   Special Requests NONE  Final   Sputum evaluation   Final    THIS SPECIMEN IS ACCEPTABLE. RESPIRATORY CULTURE REPORT TO FOLLOW.   Report Status 10/24/2016 FINAL  Final  Culture, respiratory (NON-Expectorated)     Status: None (Preliminary result)   Collection Time: 10/24/16 10:41 AM  Result Value Ref Range Status   Specimen Description SPUTUM  Final   Special Requests NONE  Final   Gram Stain   Final    ABUNDANT WBC PRESENT, PREDOMINANTLY PMN MODERATE GRAM POSITIVE COCCI IN PAIRS FEW GRAM POSITIVE COCCI IN CLUSTERS MODERATE GRAM NEGATIVE RODS    Culture   Final    CULTURE REINCUBATED FOR BETTER GROWTH Performed at [facility]    Report Status PENDING  Incomplete     Time coordinating discharge: Over 30 minutes  SIGNED:  Latrelle DodrillEdwin Silva, MD  [facility] 10/25/2016, 3:23 PM Pager   If 7PM-7AM, please contact night-coverage [email or web address] Password [email or web address]

## 2016-10-27 LAB — CULTURE, RESPIRATORY

## 2016-10-27 LAB — CULTURE, RESPIRATORY W GRAM STAIN

## 2016-10-29 LAB — CULTURE, BLOOD (ROUTINE X 2)
Culture: NO GROWTH
Culture: NO GROWTH

## 2016-12-05 IMAGING — CR DG CHEST 2V
4 series · 4 of 4 positions shown · non-contrast
Comparison: 12/31/2015

CLINICAL DATA: Not feeling well for several days. Productive cough.
Shortness of breath. Tobacco use.

EXAM:
CHEST  2 VIEW

[w chest lat (1 of 2)]
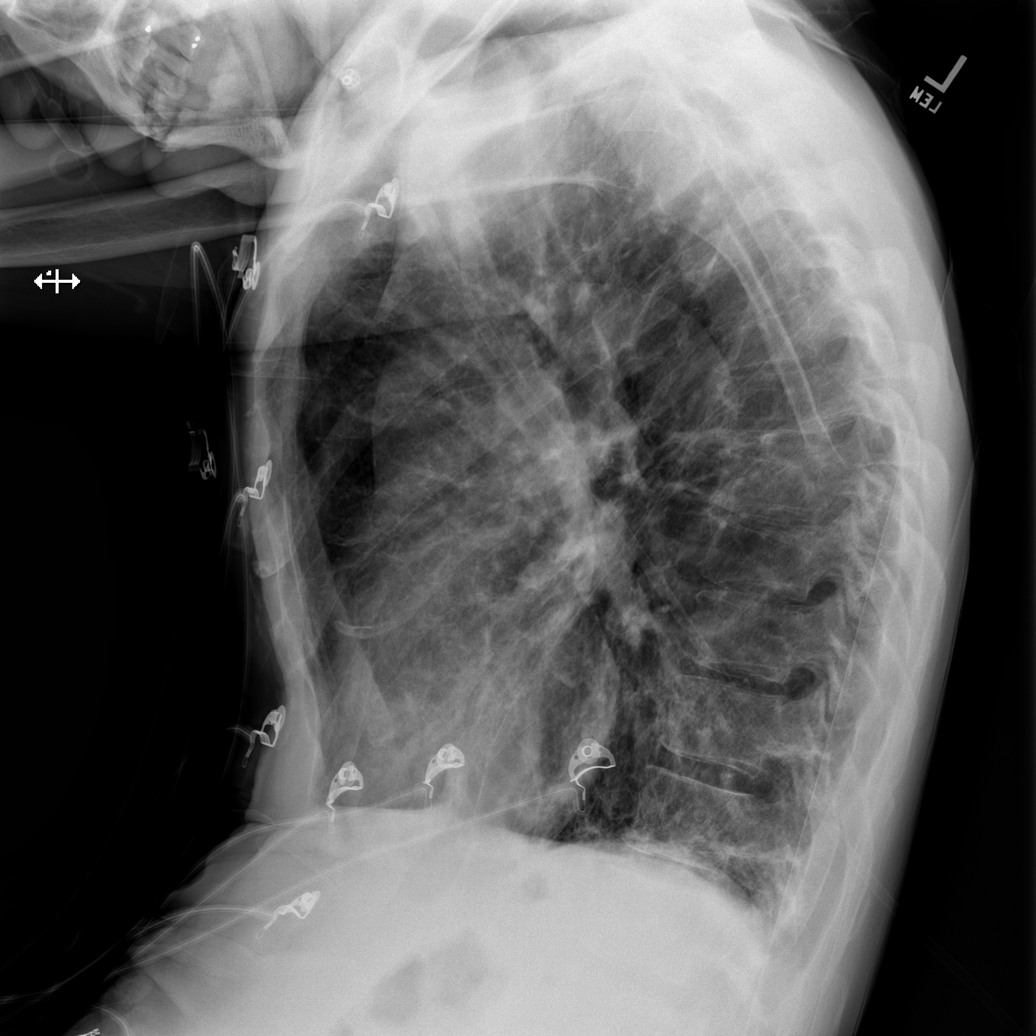

[x chest ap (1 of 2)]
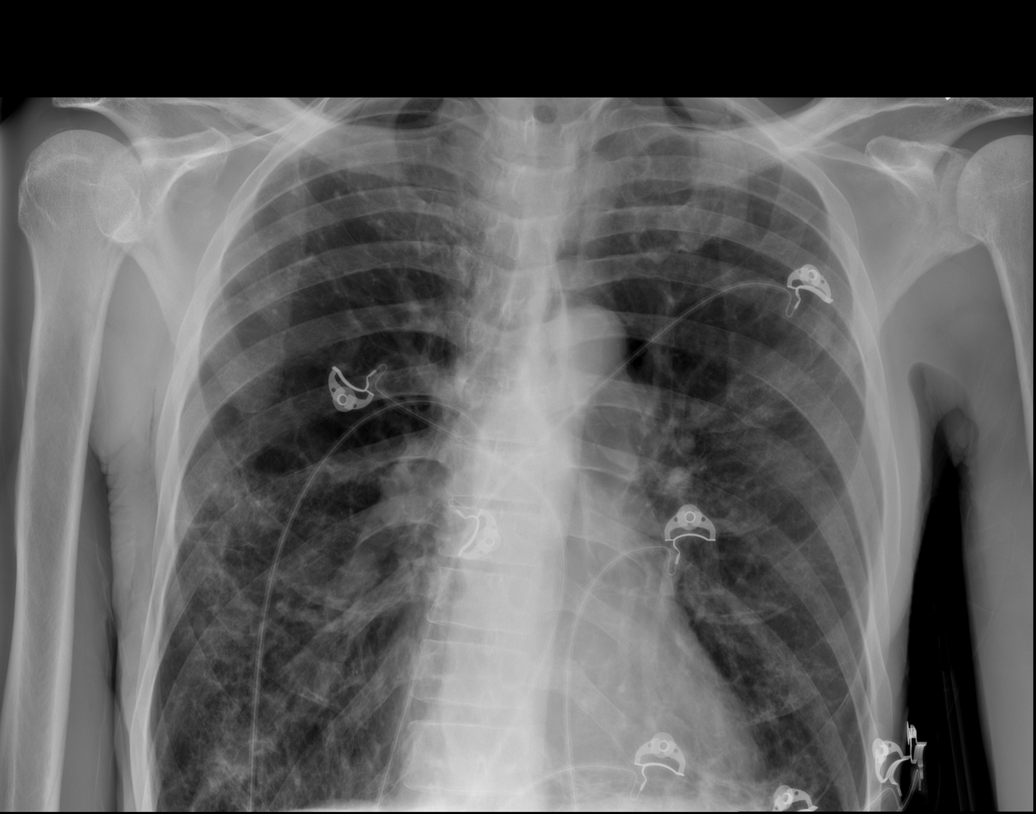

[x chest ap (2 of 2)]
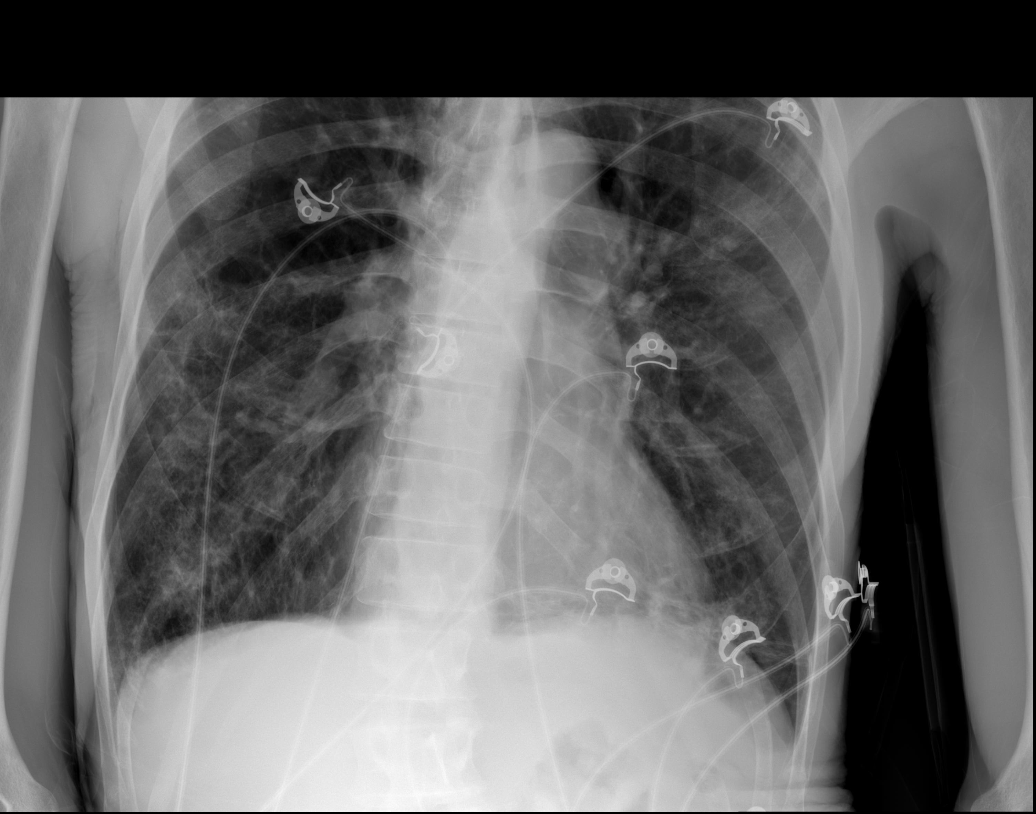

[w chest lat (2 of 2)]
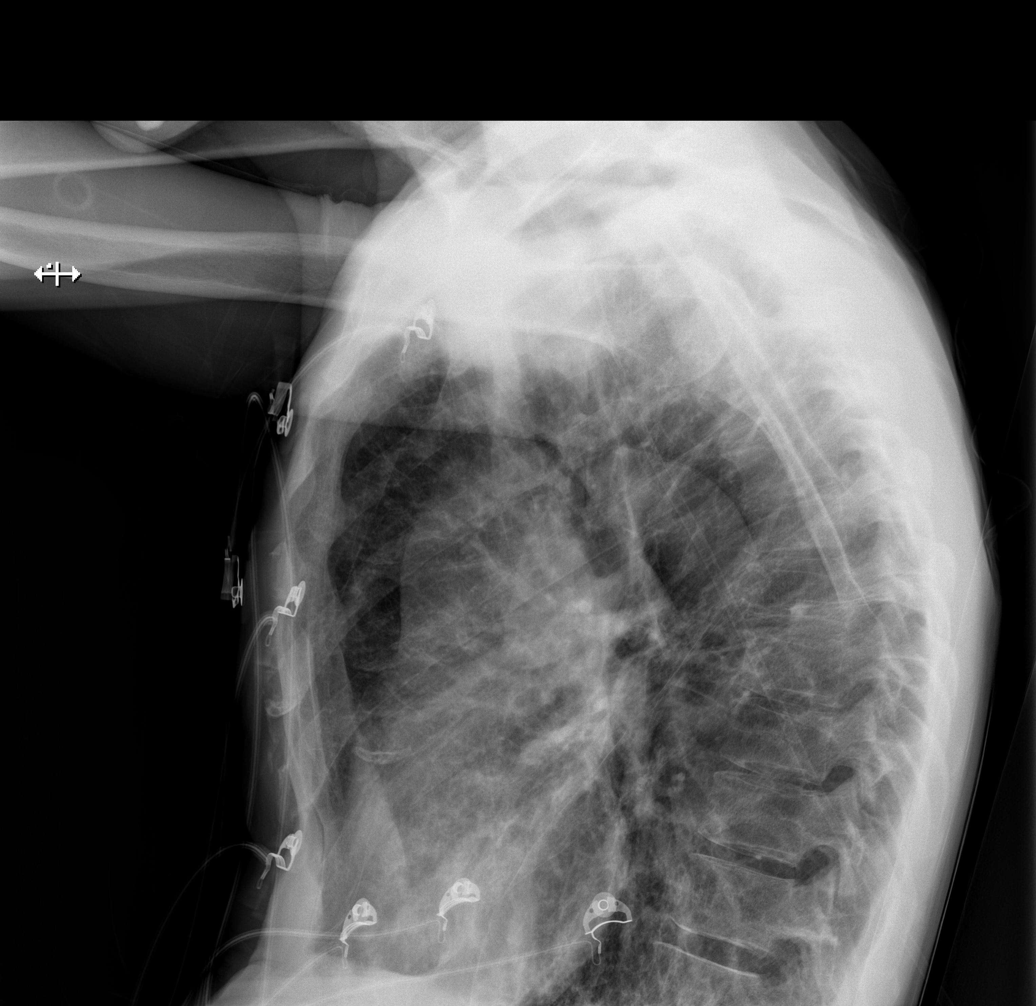

[4 of 4 positions shown; findings below may reference images not displayed]

FINDINGS: Mild pectus excavatum deformity. Moderate hyperinflation. Patient
rotated left on the frontal. Midline trachea. Stable heart size and
mediastinal contours. No pleural effusion or pneumothorax. Moderate
lower lobe predominant interstitial thickening persists. Patchy
right-sided airspace disease on the frontal radiograph is likely in
the right middle and possibly right lower lobe on the lateral view.
IMPRESSION: Right-sided airspace disease is likely within the right middle and
possibly right lower lobes. This is most consistent with infection,
superimposed upon moderate chronic pulmonary interstitial
thickening.

## 2016-12-29 ENCOUNTER — Emergency Department (HOSPITAL_COMMUNITY)
Admission: EM | Admit: 2016-12-29 | Discharge: 2016-12-30 | Disposition: A | Discharge status: Home / Self Care | Payer: Medicare Other | Attending: Emergency Medicine | Admitting: Emergency Medicine

## 2016-12-29 ENCOUNTER — Emergency Department (HOSPITAL_COMMUNITY): Payer: Medicare Other

## 2016-12-29 DIAGNOSIS — Y939 Activity, unspecified: Secondary | ICD-10-CM | POA: Insufficient documentation

## 2016-12-29 DIAGNOSIS — Y92410 Unspecified street and highway as the place of occurrence of the external cause: Secondary | ICD-10-CM | POA: Insufficient documentation

## 2016-12-29 DIAGNOSIS — R4182 Altered mental status, unspecified: Secondary | ICD-10-CM | POA: Insufficient documentation

## 2016-12-29 DIAGNOSIS — N390 Urinary tract infection, site not specified: Secondary | ICD-10-CM | POA: Insufficient documentation

## 2016-12-29 DIAGNOSIS — Z79899 Other long term (current) drug therapy: Secondary | ICD-10-CM | POA: Insufficient documentation

## 2016-12-29 DIAGNOSIS — Z5181 Encounter for therapeutic drug level monitoring: Secondary | ICD-10-CM | POA: Insufficient documentation

## 2016-12-29 DIAGNOSIS — Y999 Unspecified external cause status: Secondary | ICD-10-CM | POA: Insufficient documentation

## 2016-12-29 LAB — CBC
HEMATOCRIT: 42.3 % (ref 39.0–52.0)
Hemoglobin: 14.1 g/dL (ref 13.0–17.0)
MCH: 31.5 pg (ref 26.0–34.0)
MCHC: 33.3 g/dL (ref 30.0–36.0)
MCV: 94.6 fL (ref 78.0–100.0)
Platelets: 192 10*3/uL (ref 150–400)
RBC: 4.47 MIL/uL (ref 4.22–5.81)
RDW: 13.9 % (ref 11.5–15.5)
WBC: 5.5 10*3/uL (ref 4.0–10.5)

## 2016-12-29 LAB — I-STAT CHEM 8, ED
BUN: 11 mg/dL (ref 6–20)
Calcium, Ion: 1.14 mmol/L — ABNORMAL LOW (ref 1.15–1.40)
Chloride: 102 mmol/L (ref 101–111)
Creatinine, Ser: 1 mg/dL (ref 0.61–1.24)
Glucose, Bld: 86 mg/dL (ref 65–99)
HCT: 45 % (ref 39.0–52.0)
HEMOGLOBIN: 15.3 g/dL (ref 13.0–17.0)
POTASSIUM: 4 mmol/L (ref 3.5–5.1)
SODIUM: 142 mmol/L (ref 135–145)
TCO2: 30 mmol/L (ref 0–100)

## 2016-12-29 LAB — URINALYSIS, ROUTINE W REFLEX MICROSCOPIC
BILIRUBIN URINE: NEGATIVE
Glucose, UA: NEGATIVE mg/dL
Hgb urine dipstick: NEGATIVE
Ketones, ur: NEGATIVE mg/dL
NITRITE: NEGATIVE
Protein, ur: NEGATIVE mg/dL
SPECIFIC GRAVITY, URINE: 1.017 (ref 1.005–1.030)
SQUAMOUS EPITHELIAL / LPF: NONE SEEN
pH: 6 (ref 5.0–8.0)

## 2016-12-29 LAB — COMPREHENSIVE METABOLIC PANEL
ALBUMIN: 4.1 g/dL (ref 3.5–5.0)
ALT: 25 U/L (ref 17–63)
AST: 34 U/L (ref 15–41)
Alkaline Phosphatase: 83 U/L (ref 38–126)
Anion gap: 7 (ref 5–15)
BUN: 8 mg/dL (ref 6–20)
CHLORIDE: 102 mmol/L (ref 101–111)
CO2: 29 mmol/L (ref 22–32)
Calcium: 9.2 mg/dL (ref 8.9–10.3)
Creatinine, Ser: 0.89 mg/dL (ref 0.61–1.24)
GFR calc Af Amer: >60 mL/min (ref >60)
GFR calc non Af Amer: >60 mL/min (ref >60)
GLUCOSE: 87 mg/dL (ref 65–99)
POTASSIUM: 3.9 mmol/L (ref 3.5–5.1)
Sodium: 138 mmol/L (ref 135–145)
Total Bilirubin: 0.4 mg/dL (ref 0.3–1.2)
Total Protein: 8.1 g/dL (ref 6.5–8.1)

## 2016-12-29 LAB — RAPID URINE DRUG SCREEN, HOSP PERFORMED
Amphetamines: NOT DETECTED
BARBITURATES: NOT DETECTED
Benzodiazepines: POSITIVE — AB
COCAINE: NOT DETECTED
Opiates: POSITIVE — AB
Tetrahydrocannabinol: POSITIVE — AB

## 2016-12-29 LAB — TYPE AND SCREEN
ABO/RH(D): O POS
ANTIBODY SCREEN: NEGATIVE
UNIT DIVISION: 0
Unit division: 0

## 2016-12-29 LAB — ABO/RH: ABO/RH(D): O POS

## 2016-12-29 LAB — PREPARE FRESH FROZEN PLASMA
UNIT DIVISION: 0
Unit division: 0

## 2016-12-29 LAB — ETHANOL: Alcohol, Ethyl (B): <5 mg/dL (ref <5)

## 2016-12-29 LAB — PROTIME-INR
INR: 1
PROTHROMBIN TIME: 13.2 s (ref 11.4–15.2)

## 2016-12-29 LAB — I-STAT CG4 LACTIC ACID, ED: LACTIC ACID, VENOUS: 0.81 mmol/L (ref 0.5–1.9)

## 2016-12-29 MED ORDER — NALOXONE HCL 0.4 MG/ML IJ SOLN: 0.4000 mg | INTRAMUSCULAR | Status: DC | PRN

## 2016-12-29 MED ORDER — SODIUM CHLORIDE 0.9 % IV BOLUS (SEPSIS)
1000.0000 mL | Freq: Once | INTRAVENOUS | Status: AC
Start: 2016-12-29 — End: 2016-12-30
  Administered 2016-12-29: 1000 mL via INTRAVENOUS

## 2016-12-29 MED ORDER — NALOXONE HCL 2 MG/2ML IJ SOSY
1.0000 mg | PREFILLED_SYRINGE | Freq: Once | INTRAMUSCULAR | Status: AC
  Administered 2016-12-29: 1 mg via INTRAVENOUS
  Filled 2016-12-29: qty 2

## 2016-12-29 MED ORDER — SODIUM CHLORIDE 0.9 % IV SOLN
INTRAVENOUS | Status: DC
  Administered 2016-12-30: 01:00:00 via INTRAVENOUS

## 2016-12-29 MED ORDER — NALOXONE HCL 0.4 MG/ML IJ SOLN
INTRAMUSCULAR | Status: AC
  Filled 2016-12-29: qty 1
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
## 2016-12-29 NOTE — ED Notes (Signed)
Pt verbalized that he consents to blood draw from [employer]. Pt understands stated "yea" when informed that blood draw was to test for alcohol and other substances.
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
## 2016-12-30 ENCOUNTER — Encounter (HOSPITAL_COMMUNITY): Payer: Self-pay

## 2016-12-30 ENCOUNTER — Encounter (HOSPITAL_COMMUNITY): Payer: Self-pay | Admitting: Emergency Medicine

## 2016-12-30 LAB — CDS SEROLOGY

## 2016-12-30 LAB — BLOOD PRODUCT ORDER (VERBAL) VERIFICATION

## 2016-12-30 MED ORDER — CEPHALEXIN 500 MG PO CAPS: 500.0000 mg | ORAL_CAPSULE | Freq: Two times a day (BID) | ORAL | 0 refills | Status: DC

## 2016-12-30 MED ORDER — DEXTROSE 5 % IV SOLN
1.0000 g | Freq: Once | INTRAVENOUS | Status: AC
  Administered 2016-12-30: 1 g via INTRAVENOUS
  Filled 2016-12-30: qty 10

## 2016-12-30 NOTE — ED Provider Notes (Signed)
I was signed out patient as pending sober for dispo.  Upon review of the record, I see patietn also has UTI.  He was given ceftriaxone in the ED.  He was able to ambulate on his own without any assistance.  He is clinically sober.  PCP fu advised.  He appears well and in NAD. VS remain within his normal limits and he is safe for Dc   Tomasita CrumbleAdeleke Vedder Brittian, MD 12/30/16 [phone]

## 2016-12-30 NOTE — ED Notes (Signed)
Ambulated pt in hallway. Pt stood up as a 1 person assist, and walked with unsteady gait. Pt stated he felt dizzy and weak in his legs. Upon assisting back in bed, patient's right knee buckled and I eased Pt back in bed.

## 2018-04-08 ENCOUNTER — Encounter (HOSPITAL_COMMUNITY): Payer: Self-pay

## 2018-04-08 ENCOUNTER — Emergency Department (HOSPITAL_COMMUNITY): Payer: No Typology Code available for payment source

## 2018-04-08 ENCOUNTER — Emergency Department (HOSPITAL_COMMUNITY)
Admission: EM | Admit: 2018-04-08 | Discharge: 2018-04-09 | Disposition: A | Discharge status: Home / Self Care | Payer: No Typology Code available for payment source | Attending: Emergency Medicine | Admitting: Emergency Medicine

## 2018-04-08 ENCOUNTER — Other Ambulatory Visit: Payer: Self-pay

## 2018-04-08 DIAGNOSIS — R410 Disorientation, unspecified: Secondary | ICD-10-CM | POA: Insufficient documentation

## 2018-04-08 DIAGNOSIS — F1910 Other psychoactive substance abuse, uncomplicated: Secondary | ICD-10-CM | POA: Insufficient documentation

## 2018-04-08 LAB — DIFFERENTIAL
BASOS PCT: 0 %
Basophils Absolute: 0 10*3/uL (ref 0.0–0.1)
EOS PCT: 1 %
Eosinophils Absolute: 0.1 10*3/uL (ref 0.0–0.7)
Lymphocytes Relative: 29 %
Lymphs Abs: 2.1 10*3/uL (ref 0.7–4.0)
MONO ABS: 0.6 10*3/uL (ref 0.1–1.0)
Monocytes Relative: 9 %
Neutro Abs: 4.4 10*3/uL (ref 1.7–7.7)
Neutrophils Relative %: 61 %

## 2018-04-08 LAB — COMPREHENSIVE METABOLIC PANEL
ALT: 33 U/L (ref 17–63)
ANION GAP: 8 (ref 5–15)
AST: 38 U/L (ref 15–41)
Albumin: 3.6 g/dL (ref 3.5–5.0)
Alkaline Phosphatase: 101 U/L (ref 38–126)
BUN: 7 mg/dL (ref 6–20)
CALCIUM: 9.2 mg/dL (ref 8.9–10.3)
CHLORIDE: 102 mmol/L (ref 101–111)
CO2: 29 mmol/L (ref 22–32)
Creatinine, Ser: 0.92 mg/dL (ref 0.61–1.24)
GFR calc Af Amer: >60 mL/min (ref >60)
GFR calc non Af Amer: >60 mL/min (ref >60)
Glucose, Bld: 99 mg/dL (ref 65–99)
Potassium: 4.6 mmol/L (ref 3.5–5.1)
SODIUM: 139 mmol/L (ref 135–145)
Total Bilirubin: 0.6 mg/dL (ref 0.3–1.2)
Total Protein: 8.1 g/dL (ref 6.5–8.1)

## 2018-04-08 LAB — URINALYSIS, ROUTINE W REFLEX MICROSCOPIC
Bilirubin Urine: NEGATIVE
Glucose, UA: NEGATIVE mg/dL
HGB URINE DIPSTICK: NEGATIVE
KETONES UR: NEGATIVE mg/dL
NITRITE: NEGATIVE
PROTEIN: NEGATIVE mg/dL
Specific Gravity, Urine: 1.009 (ref 1.005–1.030)
Squamous Epithelial / LPF: NONE SEEN
pH: 7 (ref 5.0–8.0)

## 2018-04-08 LAB — BLOOD GAS, ARTERIAL
Acid-Base Excess: 3.2 mmol/L — ABNORMAL HIGH (ref 0.0–2.0)
BICARBONATE: 27 mmol/L (ref 20.0–28.0)
Drawn by: 51425
FIO2: 21
O2 Saturation: 96.5 %
PATIENT TEMPERATURE: 98.6
PH ART: 7.443 (ref 7.350–7.450)
pCO2 arterial: 40.2 mmHg (ref 32.0–48.0)
pO2, Arterial: 87 mmHg (ref 83.0–108.0)

## 2018-04-08 LAB — RAPID URINE DRUG SCREEN, HOSP PERFORMED
AMPHETAMINES: NOT DETECTED
BENZODIAZEPINES: POSITIVE — AB
Barbiturates: NOT DETECTED
Cocaine: NOT DETECTED
OPIATES: POSITIVE — AB
Tetrahydrocannabinol: POSITIVE — AB

## 2018-04-08 LAB — ETHANOL

## 2018-04-08 LAB — PROTIME-INR
INR: 1.05
Prothrombin Time: 13.6 seconds (ref 11.4–15.2)

## 2018-04-08 LAB — I-STAT TROPONIN, ED: Troponin i, poc: 0 ng/mL (ref 0.00–0.08)

## 2018-04-08 LAB — APTT: aPTT: 33 seconds (ref 24–36)

## 2018-04-08 LAB — CBC
HCT: 42.2 % (ref 39.0–52.0)
Hemoglobin: 13.8 g/dL (ref 13.0–17.0)
MCH: 30.9 pg (ref 26.0–34.0)
MCHC: 32.7 g/dL (ref 30.0–36.0)
MCV: 94.6 fL (ref 78.0–100.0)
Platelets: 193 10*3/uL (ref 150–400)
RBC: 4.46 MIL/uL (ref 4.22–5.81)
RDW: 13.1 % (ref 11.5–15.5)
WBC: 7.3 10*3/uL (ref 4.0–10.5)

## 2018-04-08 LAB — CBG MONITORING, ED: Glucose-Capillary: 87 mg/dL (ref 65–99)

## 2018-04-08 LAB — AMMONIA: AMMONIA: 23 umol/L (ref 9–35)

## 2018-04-08 MED ORDER — NALOXONE HCL 2 MG/2ML IJ SOSY
PREFILLED_SYRINGE | INTRAMUSCULAR | Status: AC
  Administered 2018-04-08: 1 mg
  Filled 2018-04-08: qty 2

## 2018-04-08 NOTE — ED Notes (Signed)
Dr.Knapp at bedside speaking with pt and family.
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
## 2018-04-08 NOTE — ED Triage Notes (Signed)
Pt friend brought him and stated that he was not coherent. He stated that about hour ago, pt started mumbling and stumbling. No unilateral weakness or numbness. He keeps stating that he is cold. Oriented to self only.

## 2018-04-08 NOTE — ED Notes (Signed)
At time of ABG being performed pt began to move and become more alert.

## 2018-04-09 NOTE — ED Notes (Signed)
Pt discharged in care of friend no acute distress at time of discharge.

## 2018-04-09 NOTE — ED Notes (Signed)
Friend contacted to transport pt home.

## 2018-04-09 NOTE — ED Provider Notes (Signed)
Pt is much better at this time. Alert and oriented. Admits to cocaine use and use of xanax today. Likely the cause of his presentation. Vitals stable. Dc home.  HR 94 BP 127/96 Pulse ox 97% RA RR 14  Close pcp follow up. Pt encouraged to return to ER for new or worsening symptoms  Personally reviewed the patients labs WBC 7.3 normal hgb 13.8 ETOH negative  CT head personally reviewed: no acute intracranial abnormality      Azalia Bilisampos, Jermond Burkemper, MD 04/09/18 [id number]

## 2018-04-09 NOTE — ED Notes (Signed)
Pt ambulated to restroom without difficulty and is now alert and oriented x4.

## 2022-06-02 ENCOUNTER — Encounter (HOSPITAL_COMMUNITY): Payer: Self-pay | Admitting: Internal Medicine

## 2022-06-02 ENCOUNTER — Inpatient Hospital Stay (HOSPITAL_COMMUNITY): Payer: Medicare Other

## 2022-06-02 ENCOUNTER — Inpatient Hospital Stay (HOSPITAL_COMMUNITY)
Admission: EM | Admit: 2022-06-02 | Discharge: 2022-06-10 | DRG: 871 | Disposition: A | Discharge status: Skilled Nursing Facility | Payer: Medicare Other | Attending: Internal Medicine | Admitting: Internal Medicine

## 2022-06-02 ENCOUNTER — Emergency Department (HOSPITAL_COMMUNITY): Payer: Medicare Other

## 2022-06-02 DIAGNOSIS — R739 Hyperglycemia, unspecified: Secondary | ICD-10-CM | POA: Diagnosis present

## 2022-06-02 DIAGNOSIS — Z87891 Personal history of nicotine dependence: Secondary | ICD-10-CM | POA: Diagnosis not present

## 2022-06-02 DIAGNOSIS — Z8249 Family history of ischemic heart disease and other diseases of the circulatory system: Secondary | ICD-10-CM | POA: Diagnosis not present

## 2022-06-02 DIAGNOSIS — J9601 Acute respiratory failure with hypoxia: Secondary | ICD-10-CM | POA: Diagnosis present

## 2022-06-02 DIAGNOSIS — Z8701 Personal history of pneumonia (recurrent): Secondary | ICD-10-CM

## 2022-06-02 DIAGNOSIS — E871 Hypo-osmolality and hyponatremia: Secondary | ICD-10-CM | POA: Diagnosis present

## 2022-06-02 DIAGNOSIS — Z20822 Contact with and (suspected) exposure to covid-19: Secondary | ICD-10-CM | POA: Diagnosis present

## 2022-06-02 DIAGNOSIS — Z681 Body mass index (BMI) 19 or less, adult: Secondary | ICD-10-CM | POA: Diagnosis not present

## 2022-06-02 DIAGNOSIS — E440 Moderate protein-calorie malnutrition: Secondary | ICD-10-CM | POA: Diagnosis present

## 2022-06-02 DIAGNOSIS — R339 Retention of urine, unspecified: Secondary | ICD-10-CM | POA: Diagnosis present

## 2022-06-02 DIAGNOSIS — Z79899 Other long term (current) drug therapy: Secondary | ICD-10-CM

## 2022-06-02 DIAGNOSIS — R634 Abnormal weight loss: Secondary | ICD-10-CM | POA: Diagnosis present

## 2022-06-02 DIAGNOSIS — R771 Abnormality of globulin: Secondary | ICD-10-CM | POA: Diagnosis present

## 2022-06-02 DIAGNOSIS — I10 Essential (primary) hypertension: Secondary | ICD-10-CM | POA: Diagnosis present

## 2022-06-02 DIAGNOSIS — R0602 Shortness of breath: Secondary | ICD-10-CM | POA: Diagnosis present

## 2022-06-02 DIAGNOSIS — R338 Other retention of urine: Secondary | ICD-10-CM | POA: Diagnosis present

## 2022-06-02 DIAGNOSIS — J439 Emphysema, unspecified: Secondary | ICD-10-CM | POA: Diagnosis present

## 2022-06-02 DIAGNOSIS — A419 Sepsis, unspecified organism: Principal | ICD-10-CM | POA: Diagnosis present

## 2022-06-02 DIAGNOSIS — R64 Cachexia: Secondary | ICD-10-CM | POA: Diagnosis present

## 2022-06-02 DIAGNOSIS — D649 Anemia, unspecified: Secondary | ICD-10-CM | POA: Diagnosis present

## 2022-06-02 DIAGNOSIS — J189 Pneumonia, unspecified organism: Secondary | ICD-10-CM | POA: Diagnosis present

## 2022-06-02 DIAGNOSIS — M1990 Unspecified osteoarthritis, unspecified site: Secondary | ICD-10-CM | POA: Diagnosis present

## 2022-06-02 LAB — PROTIME-INR
INR: 1 (ref 0.8–1.2)
Prothrombin Time: 12.9 seconds (ref 11.4–15.2)

## 2022-06-02 LAB — COMPREHENSIVE METABOLIC PANEL
ALT: 31 U/L (ref 0–44)
AST: 52 U/L — ABNORMAL HIGH (ref 15–41)
Albumin: 2.8 g/dL — ABNORMAL LOW (ref 3.5–5.0)
Alkaline Phosphatase: 101 U/L (ref 38–126)
Anion gap: 9 (ref 5–15)
BUN: 17 mg/dL (ref 8–23)
CO2: 21 mmol/L — ABNORMAL LOW (ref 22–32)
Calcium: 9.1 mg/dL (ref 8.9–10.3)
Chloride: 105 mmol/L (ref 98–111)
Creatinine, Ser: 0.64 mg/dL (ref 0.61–1.24)
GFR, Estimated: >60 mL/min (ref >60)
Glucose, Bld: 142 mg/dL — ABNORMAL HIGH (ref 70–99)
Potassium: 3.7 mmol/L (ref 3.5–5.1)
Sodium: 135 mmol/L (ref 135–145)
Total Bilirubin: 0.4 mg/dL (ref 0.3–1.2)
Total Protein: 8.9 g/dL — ABNORMAL HIGH (ref 6.5–8.1)

## 2022-06-02 LAB — URINALYSIS, ROUTINE W REFLEX MICROSCOPIC
Bacteria, UA: NONE SEEN
Bilirubin Urine: NEGATIVE
Glucose, UA: NEGATIVE mg/dL
Ketones, ur: NEGATIVE mg/dL
Nitrite: NEGATIVE
Protein, ur: 30 mg/dL — AB
RBC / HPF: >50 RBC/hpf — ABNORMAL HIGH (ref 0–5)
Specific Gravity, Urine: 1.012 (ref 1.005–1.030)
WBC, UA: >50 WBC/hpf — ABNORMAL HIGH (ref 0–5)
pH: 6 (ref 5.0–8.0)

## 2022-06-02 LAB — PHOSPHORUS: Phosphorus: 5.6 mg/dL — ABNORMAL HIGH (ref 2.5–4.6)

## 2022-06-02 LAB — CBC WITH DIFFERENTIAL/PLATELET
Abs Immature Granulocytes: 0.12 10*3/uL — ABNORMAL HIGH (ref 0.00–0.07)
Basophils Absolute: 0.1 10*3/uL (ref 0.0–0.1)
Basophils Relative: 0 %
Eosinophils Absolute: 0.2 10*3/uL (ref 0.0–0.5)
Eosinophils Relative: 2 %
HCT: 34.5 % — ABNORMAL LOW (ref 39.0–52.0)
Hemoglobin: 11.2 g/dL — ABNORMAL LOW (ref 13.0–17.0)
Immature Granulocytes: 1 %
Lymphocytes Relative: 17 %
Lymphs Abs: 2.1 10*3/uL (ref 0.7–4.0)
MCH: 30.4 pg (ref 26.0–34.0)
MCHC: 32.5 g/dL (ref 30.0–36.0)
MCV: 93.5 fL (ref 80.0–100.0)
Monocytes Absolute: 1.2 10*3/uL — ABNORMAL HIGH (ref 0.1–1.0)
Monocytes Relative: 9 %
Neutro Abs: 8.7 10*3/uL — ABNORMAL HIGH (ref 1.7–7.7)
Neutrophils Relative %: 71 %
Platelets: 462 10*3/uL — ABNORMAL HIGH (ref 150–400)
RBC: 3.69 MIL/uL — ABNORMAL LOW (ref 4.22–5.81)
RDW: 18.7 % — ABNORMAL HIGH (ref 11.5–15.5)
WBC: 12.3 10*3/uL — ABNORMAL HIGH (ref 4.0–10.5)
nRBC: 0 % (ref 0.0–0.2)

## 2022-06-02 LAB — APTT: aPTT: 28 seconds (ref 24–36)

## 2022-06-02 LAB — SARS CORONAVIRUS 2 BY RT PCR: SARS Coronavirus 2 by RT PCR: NEGATIVE

## 2022-06-02 LAB — LACTIC ACID, PLASMA
Lactic Acid, Venous: 2.4 mmol/L (ref 0.5–1.9)
Lactic Acid, Venous: 1 mmol/L (ref 0.5–1.9)

## 2022-06-02 LAB — MAGNESIUM: Magnesium: 2.7 mg/dL — ABNORMAL HIGH (ref 1.7–2.4)

## 2022-06-02 MED ORDER — ENOXAPARIN SODIUM 40 MG/0.4ML IJ SOSY
40.0000 mg | PREFILLED_SYRINGE | INTRAMUSCULAR | Status: DC
  Administered 2022-06-02 – 2022-06-09 (×8): 40 mg via SUBCUTANEOUS
  Filled 2022-06-02 (×8): qty 0.4

## 2022-06-02 MED ORDER — LACTATED RINGERS IV BOLUS
1000.0000 mL | Freq: Once | INTRAVENOUS | Status: AC
  Administered 2022-06-02: 1000 mL via INTRAVENOUS

## 2022-06-02 MED ORDER — ONDANSETRON HCL 4 MG/2ML IJ SOLN: 4.0000 mg | Freq: Four times a day (QID) | INTRAMUSCULAR | Status: DC | PRN

## 2022-06-02 MED ORDER — IOHEXOL 350 MG/ML SOLN
80.0000 mL | Freq: Once | INTRAVENOUS | Status: AC | PRN
  Administered 2022-06-02: 80 mL via INTRAVENOUS

## 2022-06-02 MED ORDER — LACTATED RINGERS IV BOLUS (SEPSIS)
500.0000 mL | Freq: Once | INTRAVENOUS | Status: AC
  Administered 2022-06-02: 500 mL via INTRAVENOUS

## 2022-06-02 MED ORDER — ACETAMINOPHEN 325 MG PO TABS
650.0000 mg | ORAL_TABLET | Freq: Four times a day (QID) | ORAL | Status: DC | PRN
  Administered 2022-06-08 – 2022-06-10 (×2): 650 mg via ORAL
  Filled 2022-06-02 (×2): qty 2

## 2022-06-02 MED ORDER — SODIUM CHLORIDE 0.9 % IV SOLN
1.0000 g | INTRAVENOUS | Status: AC
  Administered 2022-06-03 – 2022-06-06 (×4): 1 g via INTRAVENOUS
  Filled 2022-06-02 (×4): qty 10

## 2022-06-02 MED ORDER — ONDANSETRON HCL 4 MG PO TABS: 4.0000 mg | ORAL_TABLET | Freq: Four times a day (QID) | ORAL | Status: DC | PRN

## 2022-06-02 MED ORDER — ALBUTEROL SULFATE (2.5 MG/3ML) 0.083% IN NEBU
2.5000 mg | INHALATION_SOLUTION | RESPIRATORY_TRACT | Status: DC | PRN
  Filled 2022-06-02: qty 3

## 2022-06-02 MED ORDER — SODIUM CHLORIDE 0.9 % IV SOLN
2.0000 g | Freq: Once | INTRAVENOUS | Status: AC
  Administered 2022-06-02: 2 g via INTRAVENOUS
  Filled 2022-06-02: qty 20

## 2022-06-02 MED ORDER — ACETAMINOPHEN 650 MG RE SUPP: 650.0000 mg | Freq: Four times a day (QID) | RECTAL | Status: DC | PRN

## 2022-06-02 MED ORDER — SODIUM CHLORIDE 0.9 % IV SOLN
500.0000 mg | INTRAVENOUS | Status: DC
Start: 2022-06-02 — End: 2022-06-03
  Administered 2022-06-02: 500 mg via INTRAVENOUS
  Filled 2022-06-02 (×2): qty 5

## 2022-06-02 NOTE — ED Notes (Signed)
Dr. Anitra Lauth aware bladder scan shows greater than 900 mL. New order for temp foley catheter to follow.

## 2022-06-02 NOTE — ED Provider Notes (Signed)
Signout note  71 year old gentleman presenting to ER due to concern for cough, productive cough, shortness of breath.  Patient seem to be somewhat confused.  Abdomen seems to be more distended but nontender.  Bladder scan concerning for acute urinary retention.  UA with possible urinary tract infection, lactate slightly elevated WBCs elevated.  Patient slightly hypoxic and placed on 2 L nasal cannula.  Ceftriaxone started for antibiotics.  At time of signout, plan to follow-up on CXR.  Plan to admit patient.  CXR showed likely pleural effusion and lower lung infiltrate. Added azithromycin. Updated patient, he is agreeable.    Milagros Loll, MD 06/02/22 1704

## 2022-06-02 NOTE — ED Provider Notes (Signed)
[facility]-EMERGENCY DEPT Provider Note   CSN: [id number] Arrival date & time: 06/02/22  1441     History  No chief complaint on file.   Devon Wall. is a 71 y.o. male.  Patient is a 71 year old male with minimal medical history who is presenting today with complaints of shortness of breath and he mumbles that he has pneumonia.  Patient is by himself and does not give much history.  He does indicate that he feels short of breath but it is unclear how long this has been going on.  He also does admit to a productive cough and poor appetite.  He denies vomiting or diarrhea.  He denies any abdominal pain.  He reports his nephew brought him but currently his nephew is not present.  He was noted to be hypoxic in the waiting room.  He denies any tobacco use but in the past has been noted to use cocaine based on prior medical records.  The history is provided by the patient and medical records.       Home Medications Prior to Admission medications   Medication Sig Start Date End Date Taking? Authorizing Provider  albuterol (PROVENTIL HFA;VENTOLIN HFA) 108 (90 BASE) MCG/ACT inhaler Inhale 2 puffs into the lungs every 6 (six) hours as needed for wheezing or shortness of breath. Patient not taking: Reported on 10/23/2016 10/13/14   Dhungel, Theda Belfast, MD  amLODipine (NORVASC) 5 MG tablet Take 5 mg by mouth daily. 04/04/18   [provider]  cephALEXin (KEFLEX) 500 MG capsule Take 1 capsule (500 mg total) by mouth 2 (two) times daily. Patient not taking: Reported on 04/08/2018 12/30/16   Tomasita Crumble, MD  oxycodone (ROXICODONE) 30 MG immediate release tablet Take 30 mg by mouth 4 (four) times daily. 04/04/18   [provider]  oxyCODONE-acetaminophen (PERCOCET/ROXICET) 5-325 MG tablet Take 1 tablet by mouth every 4 (four) hours as needed for severe pain. Patient not taking: Reported on 04/08/2018 01/17/16   Charlynne Pander, MD      Allergies    Patient  has no known allergies.    Review of Systems   Review of Systems  Physical Exam Updated Vital Signs BP 104/85 (BP Location: Left Arm)   Pulse (!) 108   Temp 97.8 F (36.6 C) (Oral)   Resp 19   SpO2 92%  Physical Exam Vitals and nursing note reviewed.  Constitutional:      General: He is not in acute distress.    Appearance: He is well-developed and underweight. He is ill-appearing.  HENT:     Head: Normocephalic and atraumatic.  Eyes:     Conjunctiva/sclera: Conjunctivae normal.     Pupils: Pupils are equal, round, and reactive to light.  Cardiovascular:     Rate and Rhythm: Regular rhythm. Tachycardia present.     Heart sounds: No murmur heard. Pulmonary:     Effort: Pulmonary effort is normal. No respiratory distress.     Breath sounds: Normal breath sounds. No wheezing or rales.  Abdominal:     General: There is distension.     Palpations: Abdomen is soft.     Tenderness: There is no abdominal tenderness. There is no guarding or rebound.     Comments: Bladder firmness and distention noted.  No specific pain  Musculoskeletal:        General: No tenderness. Normal range of motion.     Cervical back: Normal range of motion and neck supple.  Skin:  General: Skin is warm and dry.     Findings: No erythema or rash.  Neurological:     Mental Status: He is lethargic.     Comments: Mumbles and answers very few questions.  Unable to tell if patient is confused or not  Psychiatric:        Behavior: Behavior normal.     ED Results / Procedures / Treatments   Labs (all labs ordered are listed, but only abnormal results are displayed) Labs Reviewed  CULTURE, BLOOD (ROUTINE X 2)  CULTURE, BLOOD (ROUTINE X 2)  URINE CULTURE  LACTIC ACID, PLASMA  LACTIC ACID, PLASMA  COMPREHENSIVE METABOLIC PANEL  CBC WITH DIFFERENTIAL/PLATELET  PROTIME-INR  APTT  URINALYSIS, ROUTINE W REFLEX MICROSCOPIC    EKG None  Radiology No results found.  Procedures Procedures     Medications Ordered in ED Medications - No data to display  ED Course/ Medical Decision Making/ A&P                           Medical Decision Making Amount and/or Complexity of Data Reviewed External Data Reviewed: notes. Labs: ordered. Decision-making details documented in ED Course. Radiology: ordered. ECG/medicine tests: ordered and independent interpretation performed. Decision-making details documented in ED Course.  Risk Decision regarding hospitalization.   Pt with multiple medical problems and comorbidities and presenting today with a complaint that caries a high risk for morbidity and mortality.  Presenting today with complaints of shortness of breath.  Patient is cachectic, hypoxic, tachycardic.  Based on review of past medical charts he has been admitted in the past for community-acquired pneumonia.  He denies any tobacco use.  He does not use oxygen at home and initial O2 sats were 89% on room air.  Patient feels warm and is tachycardic.  Undifferentiated sepsis was initiated as waiting still for a temperature.  Also patient appears to have lower abdominal distention will ensure no evidence of urinary retention.  He was incontinent on himself while being rolled back.  Concern for pneumonia versus UTI.  He has no localized abdominal pain concerning for an acute abdominal process.  We will give IV fluids.  No evidence to suggest CHF.  Foley catheter was placed and patient had greater than 900 mL out.  I independently interpreted patient's CBC, CMP and lactate.  CBC with leukocytosis of 12, stable hemoglobin of 11 and a normal platelet count.  CMP with normal renal function and electrolytes with normal total bilirubin.  Lactate is mildly elevated at 2.7.  Patient's EKG without acute findings.  Feel that patient will need admission for antibiotics still waiting on UA and chest x-ray.  Patient checked out to Dr. Stevie Kern.       Final Clinical Impression(s) / ED Diagnoses Final  diagnoses:  None    Rx / DC Orders ED Discharge Orders     None         Gwyneth Sprout, MD 06/03/22 1159

## 2022-06-02 NOTE — ED Triage Notes (Addendum)
Patient unable to answer triage questions. Patient just says he has pneumonia and does not elaborate when asked questions.  Sats in triage 89%. Patient placed on O2 2L/min and sats increased to 90%. O2 increased to 3L/min via [place] and sats 93%.

## 2022-06-02 NOTE — H&P (Addendum)
History and Physical    Patient: Devon Wall. EFE:[id number] DOB: [birth year] DOA: 06/02/2022 DOS: the patient was seen and examined on 06/02/2022 PCP: System, Provider Not In  Patient coming from: Home  Chief Complaint: Fatigue.  Thinks he has recurrence of pneumonia.  HPI: Devon Wall. is a 71 y.o. male with medical history significant of arthritis, hypertension, history of multiple episodes of pneumonia, hyponatremia, malnutrition, former cigarette smoker who is coming to the emergency department due to feeling fatigue for several days and feels like when he has a pneumonia episode in the past.  He has had some cough, but did not elaborate.  The patient is not a very good historian, but ROS with no fever, chills or night sweats. No sore throat, rhinorrhea, wheezing or hemoptysis.  No chest pain, palpitations, diaphoresis, PND, orthopnea or pitting edema of the lower extremities.  No appetite changes, abdominal pain, diarrhea, constipation, melena or hematochezia.  No flank pain, dysuria, frequency or hematuria.  No polyuria, polydipsia, polyphagia or blurred vision.  He has lost weight because he has been eating less but did not discuss further.  ED course: Initial vital signs were temperature 97.8 F, pulse 112, respiration 19, BP 104/85 mmHg and O2 sat 89% on room air.  In addition to supplemental oxygen, the patient also received 1000 mL LR bolus, 2 g of Rocephin IVPB and 500 mg of azithromycin.  I added 500 mL of LR bolus since the patient met sepsis criteria to complete 30 mL/kg of initial IVF.  Lab work: Urinalysis with large hemoglobinuria, proteinuria 30 mg/dL and large leukocyte esterase.  Microscopic examination showed more than 50 RBC and more than 50 WBC, but no bacteria.  CBC showed a white count 12.3, hemoglobin 11.2 g/dL platelets 462.  Normal PT, INR and PTT.  Lactic acid is 2.4 mmol/L.  CMP showed a CO2 of 21 mmol/L, all other electrolytes and renal function were  normal.  Glucose 142 mg/dL, total protein 8.9 and albumin 2.8 g/dL.  AST was 52 units/L.  Normal ALT, alkaline phosphatase and total bilirubin.  Imaging: Portable 1 view chest radiograph show a background pattern of chronic lung disease with suspicion of right pleural effusion and lower lung infiltrate.  Review of Systems: As mentioned in the history of present illness. All other systems reviewed and are negative. Past Medical History:  Diagnosis Date   Arthritis    Sepsis due to pneumonia ([place]) 11/10/2015   No past surgical history on file. Social History:  reports current drug use. Drug: Marijuana. He reports that he does not drink alcohol. No history on file for tobacco use.  No Known Allergies  Family History  Problem Relation Age of Onset   Hypertension Other     Prior to Admission medications   Medication Sig Start Date End Date Taking? Authorizing Provider  oxycodone (ROXICODONE) 30 MG immediate release tablet Take 30 mg by mouth every 6 (six) hours as needed for pain. 04/04/18  Yes [provider]  albuterol (PROVENTIL HFA;VENTOLIN HFA) 108 (90 BASE) MCG/ACT inhaler Inhale 2 puffs into the lungs every 6 (six) hours as needed for wheezing or shortness of breath. Patient not taking: Reported on 10/23/2016 10/13/14   Dhungel, Flonnie Overman, MD  amLODipine (NORVASC) 5 MG tablet Take 5 mg by mouth daily. 04/04/18   [provider]  cephALEXin (KEFLEX) 500 MG capsule Take 1 capsule (500 mg total) by mouth 2 (two) times daily. Patient not taking: Reported on 04/08/2018 12/30/16  Everlene Balls, MD  oxyCODONE-acetaminophen (PERCOCET/ROXICET) 5-325 MG tablet Take 1 tablet by mouth every 4 (four) hours as needed for severe pain. Patient not taking: Reported on 04/08/2018 01/17/16   Drenda Freeze, MD    Physical Exam: Vitals:   06/02/22 1552 06/02/22 1615 06/02/22 1621 06/02/22 1630  BP: 114/89 (!) 119/91  (!) 117/91  Pulse: 90 96  90  Resp: _0 Temp: 98.8 F (37.1  C) 98.8 F (37.1 C)  98.7 F (37.1 C)  TempSrc:      SpO2: 95% 97%  97%  Weight:   49.9 kg    Physical Exam Vitals and nursing note reviewed.  Constitutional:      General: He is awake.     Appearance: He is cachectic. He is ill-appearing. He is not toxic-appearing.     Interventions: Nasal cannula in place.  HENT:     Head: Normocephalic.     Mouth/Throat:     Mouth: Mucous membranes are moist.  Eyes:     General: No scleral icterus.    Pupils: Pupils are equal, round, and reactive to light.  Cardiovascular:     Rate and Rhythm: Normal rate and regular rhythm.     Heart sounds: S1 normal and S2 normal.  Pulmonary:     Effort: No tachypnea, accessory muscle usage or retractions.     Breath sounds: Examination of the right-lower field reveals rales. Examination of the left-lower field reveals rales. Rhonchi and rales present. No wheezing.  Abdominal:     General: Bowel sounds are normal. There is no distension.     Palpations: Abdomen is soft.     Tenderness: There is no abdominal tenderness. There is no right CVA tenderness, left CVA tenderness or guarding.  Musculoskeletal:     Cervical back: Neck supple.     Right lower leg: No edema.     Left lower leg: No edema.  Skin:    General: Skin is warm and dry.  Neurological:     General: No focal deficit present.     Mental Status: He is alert and oriented to person, place, and time.  Psychiatric:        Mood and Affect: Mood normal.     Data Reviewed:  Results are pending, will review when available.  Assessment and Plan: Principal Problem:   Sepsis due to undetermined organism POA ([place]) In the setting of:   CAP (community acquired pneumonia) With associated:   Acute respiratory failure with hypoxia ([place]) Admit to PCU/inpatient. Continue supplemental oxygen. Scheduled and as needed bronchodilators. Continue ceftriaxone 1 g IVPB daily. Continue azithromycin 500 mg IVPB daily. Check strep pneumoniae urinary  antigen. Check sputum Gram stain, culture and sensitivity. Follow-up blood culture and sensitivity. Follow-up CBC and chemistry in the morning.  Active Problems:   Acute urinary retention Check CT abdomen/pelvis.    Unintentional weight loss History of cigarette smoking. Given hypoxia and small infiltrate. Will check CTA chest.    Normocytic anemia Monitor hematocrit and hemoglobin.    Moderate protein malnutrition (HCC) Protein supplementation. Consider nutritional services evaluation.    Hyperglobulinemia Recheck albumin/gammaglobulin ratio.    Hyperglycemia Recheck fasting glucose.    Hypertension Currently not on medications. Will defer antihypertensives while septic. Monitor blood pressure.    Advance Care Planning:   Code Status: Full code.  Consults:   Family Communication:   Severity of Illness: The appropriate patient status for this patient is INPATIENT. Inpatient status is judged to  be reasonable and necessary in order to provide the required intensity of service to ensure the patient's safety. The patient's presenting symptoms, physical exam findings, and initial radiographic and laboratory data in the context of their chronic comorbidities is felt to place them at high risk for further clinical deterioration. Furthermore, it is not anticipated that the patient will be medically stable for discharge from the hospital within 2 midnights of admission.   * I certify that at the point of admission it is my clinical judgment that the patient will require inpatient hospital care spanning beyond 2 midnights from the point of admission due to high intensity of service, high risk for further deterioration and high frequency of surveillance required.*  Author: Reubin Milan, MD 06/02/2022 5:41 PM  For on call review [email or web address].   This document was prepared using Dragon voice recognition software and may contain some unintended transcription errors.

## 2022-06-03 ENCOUNTER — Encounter (HOSPITAL_COMMUNITY): Payer: Self-pay | Admitting: Internal Medicine

## 2022-06-03 ENCOUNTER — Other Ambulatory Visit: Payer: Self-pay

## 2022-06-03 DIAGNOSIS — R771 Abnormality of globulin: Secondary | ICD-10-CM

## 2022-06-03 DIAGNOSIS — R338 Other retention of urine: Secondary | ICD-10-CM

## 2022-06-03 LAB — CBC WITH DIFFERENTIAL/PLATELET
Abs Immature Granulocytes: 0.13 10*3/uL — ABNORMAL HIGH (ref 0.00–0.07)
Basophils Absolute: 0.1 10*3/uL (ref 0.0–0.1)
Basophils Relative: 1 %
Eosinophils Absolute: 0.3 10*3/uL (ref 0.0–0.5)
Eosinophils Relative: 2 %
HCT: 31.7 % — ABNORMAL LOW (ref 39.0–52.0)
Hemoglobin: 10.1 g/dL — ABNORMAL LOW (ref 13.0–17.0)
Immature Granulocytes: 1 %
Lymphocytes Relative: 24 %
Lymphs Abs: 2.6 10*3/uL (ref 0.7–4.0)
MCH: 30.1 pg (ref 26.0–34.0)
MCHC: 31.9 g/dL (ref 30.0–36.0)
MCV: 94.3 fL (ref 80.0–100.0)
Monocytes Absolute: 1.3 10*3/uL — ABNORMAL HIGH (ref 0.1–1.0)
Monocytes Relative: 12 %
Neutro Abs: 6.7 10*3/uL (ref 1.7–7.7)
Neutrophils Relative %: 60 %
Platelets: 414 10*3/uL — ABNORMAL HIGH (ref 150–400)
RBC: 3.36 MIL/uL — ABNORMAL LOW (ref 4.22–5.81)
RDW: 18.5 % — ABNORMAL HIGH (ref 11.5–15.5)
WBC: 11.1 10*3/uL — ABNORMAL HIGH (ref 4.0–10.5)
nRBC: 0 % (ref 0.0–0.2)

## 2022-06-03 LAB — COMPREHENSIVE METABOLIC PANEL
ALT: 25 U/L (ref 0–44)
AST: 37 U/L (ref 15–41)
Albumin: 2.4 g/dL — ABNORMAL LOW (ref 3.5–5.0)
Alkaline Phosphatase: 83 U/L (ref 38–126)
Anion gap: 9 (ref 5–15)
BUN: 13 mg/dL (ref 8–23)
CO2: 23 mmol/L (ref 22–32)
Calcium: 8.5 mg/dL — ABNORMAL LOW (ref 8.9–10.3)
Chloride: 106 mmol/L (ref 98–111)
Creatinine, Ser: 0.59 mg/dL — ABNORMAL LOW (ref 0.61–1.24)
GFR, Estimated: >60 mL/min (ref >60)
Glucose, Bld: 97 mg/dL (ref 70–99)
Potassium: 4.1 mmol/L (ref 3.5–5.1)
Sodium: 138 mmol/L (ref 135–145)
Total Bilirubin: 0.4 mg/dL (ref 0.3–1.2)
Total Protein: 7.4 g/dL (ref 6.5–8.1)

## 2022-06-03 LAB — URINE CULTURE: Culture: NO GROWTH

## 2022-06-03 MED ORDER — CHLORHEXIDINE GLUCONATE CLOTH 2 % EX PADS
6.0000 | MEDICATED_PAD | Freq: Every day | CUTANEOUS | Status: DC
  Administered 2022-06-03 – 2022-06-08 (×6): 6 via TOPICAL

## 2022-06-03 MED ORDER — AZITHROMYCIN 250 MG PO TABS
500.0000 mg | ORAL_TABLET | Freq: Every day | ORAL | Status: AC
  Administered 2022-06-03 – 2022-06-06 (×4): 500 mg via ORAL
  Filled 2022-06-03 (×4): qty 2

## 2022-06-03 NOTE — Hospital Course (Signed)
71 y.o. male with medical history significant of arthritis, hypertension, history of multiple episodes of pneumonia, hyponatremia, malnutrition, former cigarette smoker who is coming to the emergency department due to feeling fatigue for several days and feels like when he has a pneumonia episode in the past. Admitted for R sided PNA

## 2022-06-03 NOTE — Progress Notes (Signed)
PHARMACIST - PHYSICIAN COMMUNICATION DR:   Rhona Leavens CONCERNING: Antibiotic IV to Oral Route Change Policy  RECOMMENDATION: This patient is receiving azithromycin by the intravenous route.  Based on criteria approved by the Pharmacy and Therapeutics Committee, the antibiotic(s) is/are being converted to the equivalent oral dose form(s).   DESCRIPTION: These criteria include: Patient being treated for a respiratory tract infection, urinary tract infection, cellulitis or clostridium difficile associated diarrhea if on metronidazole The patient is not neutropenic and does not exhibit a GI malabsorption state The patient is eating (either orally or via tube) and/or has been taking other orally administered medications for a least 24 hours The patient is improving clinically and has a Tmax < 100.5  If you have questions about this conversion, please contact the Pharmacy Department  []   [phone] )  ( [phone] []   [phone] )   []   [phone] )  [facility] [x]   [phone] )  [facility]   [facility], PharmD 06/03/2022 11:04 AM

## 2022-06-03 NOTE — Progress Notes (Signed)
Pt arrived from ED via stretcher. Pt oriented to room/floor. Demonstrated understanding. Pt placed on telemetry. VS obtained. Pt denies any further needs at this time.

## 2022-06-03 NOTE — Progress Notes (Signed)
  Progress Note   Patient: Devon Wall. UYQ:[id number] DOB: 11/26/1951 DOA: 06/02/2022     1 DOS: the patient was seen and examined on 06/03/2022   Brief hospital course: 71 y.o. male with medical history significant of arthritis, hypertension, history of multiple episodes of pneumonia, hyponatremia, malnutrition, former cigarette smoker who is coming to the emergency department due to feeling fatigue for several days and feels like when he has a pneumonia episode in the past. Admitted for R sided PNA  Assessment and Plan: No notes have been filed under this hospital service. Service: Hospitalist Principal Problem:   Sepsis due to undetermined organism POA (HCC) In the setting of:   CAP (community acquired pneumonia) With associated:   Acute respiratory failure with hypoxia (HCC) Continue ceftriaxone 1 g IVPB daily and azithromycin 500 mg IVPB daily. Check strep pneumoniae urinary antigen. On minimal o2 support F/u blood cultures  Active Problems:   Acute urinary retention CT abdomen/pelvis reviewed, findings consistent with R sided PNA     Unintentional weight loss History of cigarette smoking. Given hypoxia and small infiltrate.  CTA chest and CT abd/pelvis reviewed, findings notable for PNA and emphysematous changes     Normocytic anemia Hemodynamically stable Hgb stable     Moderate protein malnutrition (HCC) Protein supplementation. Encourage PO intake as tolerated     Hyperglobulinemia Recheck albumin/gammaglobulin ratio.     Hyperglycemia Random glucose stable     Hypertension Currently not on medications. Will defer antihypertensives while septic. BP stable thus far        Subjective: Not very conversant, tired. Speaks mostly one word sentences or nodding  Physical Exam: Vitals:   06/03/22 0111 06/03/22 0516 06/03/22 0833 06/03/22 1228  BP: 103/64 116/76  104/69  Pulse: 98 90  (!) 105  Resp: (!) 23 (!) 23  20  Temp: 99.2 F (37.3 C) 98.8 F  (37.1 C)    TempSrc:    Oral  SpO2: 97% 97% 93% 95%  Weight:       General exam: Awake, laying in bed, in nad Respiratory system: Normal respiratory effort, no wheezing Cardiovascular system: regular rate, s1, s2 Gastrointestinal system: Soft, nondistended, positive BS Central nervous system: CN2-12 grossly intact, strength intact Extremities: Perfused, no clubbing Skin: Normal skin turgor, no notable skin lesions seen Psychiatry: Mood normal // no visual hallucinations   Data Reviewed:  Labs reviewed: K 4.1, Cr 0.59  Family Communication: Pt in room, family not at bedside  Disposition: Status is: Inpatient Remains inpatient appropriate because: Severity of illness  Planned Discharge Destination: Home     Author: Rickey Barbara, MD 06/03/2022 4:17 PM  For on call review [email or web address].

## 2022-06-03 NOTE — TOC Initial Note (Signed)
Transition of Care (TOC) - Initial/Assessment Note    Patient Details  Name: Devon Wall. MRN: [id number] Date of Birth: Dec 13, 1951  Transition of Care [facility]) CM/SW Contact:    Golda Acre, RN Phone Number: 06/03/2022, 8:42 AM  Clinical Narrative:                  Transition of Care [facility]) Screening Note   Patient Details  Name: Devon Wall. Date of Birth: 11-19-1951   Transition of Care [facility]) CM/SW Contact:    Golda Acre, RN Phone Number: 06/03/2022, 8:43 AM    Transition of Care Department [facility]) has reviewed patient and no TOC needs have been identified at this time. We will continue to monitor patient advancement through interdisciplinary progression rounds. If new patient transition needs arise, please place a TOC consult.    Expected Discharge Plan: Home/Self Care Barriers to Discharge: Continued Medical Work up   Patient Goals and CMS Choice Patient states their goals for this hospitalization and ongoing recovery are:: to go home CMS Medicare.gov Compare Post Acute Care list provided to:: Patient Choice offered to / list presented to : Patient  Expected Discharge Plan and Services Expected Discharge Plan: Home/Self Care   Discharge Planning Services: CM Consult   Living arrangements for the past 2 months: Single Family Home                                      Prior Living Arrangements/Services Living arrangements for the past 2 months: Single Family Home Lives with:: Self Patient language and need for interpreter reviewed:: Yes Do you feel safe going back to the place where you live?: Yes            Criminal Activity/Legal Involvement Pertinent to Current Situation/Hospitalization: No - Comment as needed  Activities of Daily Living Home Assistive Devices/Equipment: None (per pt) ADL Screening (condition at time of admission) Patient's cognitive ability adequate to safely complete daily activities?: No Is the  patient deaf or have difficulty hearing?: No Does the patient have difficulty seeing, even when wearing glasses/contacts?: No Does the patient have difficulty concentrating, remembering, or making decisions?: Yes (pt states no but rn reports he has had some confusion) Patient able to express need for assistance with ADLs?: Yes Does the patient have difficulty dressing or bathing?: No Independently performs ADLs?: Yes (appropriate for developmental age) Does the patient have difficulty walking or climbing stairs?: Yes Weakness of Legs: Both Weakness of Arms/Hands: Both  Permission Sought/Granted                  Emotional Assessment Appearance:: Appears stated age     Orientation: : Oriented to Self, Oriented to Place, Oriented to  Time, Oriented to Situation Alcohol / Substance Use: Not Applicable Psych Involvement: No (comment)  Admission diagnosis:  Sepsis due to undetermined organism [facility]) [A41.9] Patient Active Problem List   Diagnosis Date Noted   Sepsis due to undetermined organism (HCC) 06/02/2022   Moderate protein malnutrition (HCC) 06/02/2022   Hyperglobulinemia 06/02/2022   Hyperglycemia 06/02/2022   Acute urinary retention 06/02/2022   Hypertension 06/02/2022   Unintentional weight loss 06/02/2022   Pneumonia 10/23/2016   CAP (community acquired pneumonia) 10/23/2016   Hyponatremia 10/23/2016   Normocytic anemia 10/23/2016   Malnutrition of moderate degree 11/12/2015   Lobar pneumonia, unspecified organism (HCC) 11/11/2015   Leukocytosis 11/10/2015  Sepsis due to pneumonia (HCC) 11/10/2015   Acute respiratory failure with hypoxia (HCC) 11/10/2015   Underweight 11/10/2015   Protein-calorie malnutrition, severe (HCC) 10/11/2014   PCP:  System, Provider Not In Pharmacy:   [id number] - [place], [place] - [address] AT [address] [facility] [facility] [address] [place] [address] Phone: [phone] Fax:  [phone]     Social Determinants of Health (SDOH) Interventions    Readmission Risk Interventions     No data to display

## 2022-06-04 LAB — COMPREHENSIVE METABOLIC PANEL
ALT: 24 U/L (ref 0–44)
AST: 33 U/L (ref 15–41)
Albumin: 2.5 g/dL — ABNORMAL LOW (ref 3.5–5.0)
Alkaline Phosphatase: 83 U/L (ref 38–126)
Anion gap: 6 (ref 5–15)
BUN: 17 mg/dL (ref 8–23)
CO2: 23 mmol/L (ref 22–32)
Calcium: 8.8 mg/dL — ABNORMAL LOW (ref 8.9–10.3)
Chloride: 106 mmol/L (ref 98–111)
Creatinine, Ser: 0.55 mg/dL — ABNORMAL LOW (ref 0.61–1.24)
GFR, Estimated: >60 mL/min (ref >60)
Glucose, Bld: 100 mg/dL — ABNORMAL HIGH (ref 70–99)
Potassium: 4.1 mmol/L (ref 3.5–5.1)
Sodium: 135 mmol/L (ref 135–145)
Total Bilirubin: 0.1 mg/dL — ABNORMAL LOW (ref 0.3–1.2)
Total Protein: 7.8 g/dL (ref 6.5–8.1)

## 2022-06-04 LAB — CBC
HCT: 32.2 % — ABNORMAL LOW (ref 39.0–52.0)
Hemoglobin: 10.6 g/dL — ABNORMAL LOW (ref 13.0–17.0)
MCH: 30.7 pg (ref 26.0–34.0)
MCHC: 32.9 g/dL (ref 30.0–36.0)
MCV: 93.3 fL (ref 80.0–100.0)
Platelets: 408 10*3/uL — ABNORMAL HIGH (ref 150–400)
RBC: 3.45 MIL/uL — ABNORMAL LOW (ref 4.22–5.81)
RDW: 18.5 % — ABNORMAL HIGH (ref 11.5–15.5)
WBC: 12.5 10*3/uL — ABNORMAL HIGH (ref 4.0–10.5)
nRBC: 0 % (ref 0.0–0.2)

## 2022-06-04 LAB — CULTURE, BLOOD (ROUTINE X 2): Special Requests: ADEQUATE

## 2022-06-04 NOTE — Progress Notes (Signed)
  Progress Note   Patient: Devon Wall. PYK:[id number] DOB: 18-Oct-1951 DOA: 06/02/2022     2 DOS: the patient was seen and examined on 06/04/2022   Brief hospital course: 71 y.o. male with medical history significant of arthritis, hypertension, history of multiple episodes of pneumonia, hyponatremia, malnutrition, former cigarette smoker who is coming to the emergency department due to feeling fatigue for several days and feels like when he has a pneumonia episode in the past. Admitted for R sided PNA  Assessment and Plan: No notes have been filed under this hospital service. Service: Hospitalist Principal Problem:   Sepsis due to undetermined organism POA (HCC) In the setting of:   CAP (community acquired pneumonia) With associated:   Acute respiratory failure with hypoxia (HCC) Continue ceftriaxone 1 g IVPB daily and azithromycin 500 mg IVPB daily. Strep pneumoniae urinary antigen pending On minimal o2 support F/u blood cultures, thus far neg Reports feeling better today  Active Problems:   Acute urinary retention CT abdomen/pelvis reviewed, findings consistent with R sided PNA     Unintentional weight loss History of cigarette smoking. Given hypoxia and small infiltrate.  CTA chest and CT abd/pelvis reviewed, findings notable for PNA and emphysematous changes PT/OT consulted     Normocytic anemia Hemodynamically stable Hgb stable     Moderate protein malnutrition (HCC) Protein supplementation. Encourage PO intake as tolerated     Hyperglobulinemia     Hyperglycemia Random glucose stable     Hypertension Currently not on medications. Will defer antihypertensives while septic. BP stable thus far        Subjective: Still not very conversant but states feeling better   Physical Exam: Vitals:   06/03/22 2123 06/04/22 0557 06/04/22 1148 06/04/22 1149  BP: 106/74 101/75 94/68 96/67   Pulse: (!) 104 96 (!) 103 98  Resp: 20 19 16 16   Temp: 98.8 F (37.1 C)  98.9 F (37.2 C) 98 F (36.7 C) 98.8 F (37.1 C)  TempSrc:      SpO2: 96% 96% 97% 98%  Weight:       General exam: Conversant, in no acute distress Respiratory system: normal chest rise, clear, no audible wheezing Cardiovascular system: regular rhythm, s1-s2 Gastrointestinal system: Nondistended, nontender, pos BS Central nervous system: No seizures, no tremors Extremities: No cyanosis, no joint deformities Skin: No rashes, no pallor Psychiatry: Affect normal // no auditory hallucinations   Data Reviewed:  Labs reviewed: K 4.1, Cr 0.55  Family Communication: Pt in room, family not at bedside  Disposition: Status is: Inpatient Remains inpatient appropriate because: Severity of illness  Planned Discharge Destination: Home     Author: , MD 06/04/2022 3:30 PM  For on call review [email or web address].

## 2022-06-05 ENCOUNTER — Encounter (HOSPITAL_COMMUNITY): Payer: Self-pay

## 2022-06-05 LAB — COMPREHENSIVE METABOLIC PANEL
ALT: 25 U/L (ref 0–44)
AST: 33 U/L (ref 15–41)
Albumin: 2.6 g/dL — ABNORMAL LOW (ref 3.5–5.0)
Alkaline Phosphatase: 89 U/L (ref 38–126)
Anion gap: 7 (ref 5–15)
BUN: 21 mg/dL (ref 8–23)
CO2: 22 mmol/L (ref 22–32)
Calcium: 8.7 mg/dL — ABNORMAL LOW (ref 8.9–10.3)
Chloride: 106 mmol/L (ref 98–111)
Creatinine, Ser: 0.55 mg/dL — ABNORMAL LOW (ref 0.61–1.24)
GFR, Estimated: >60 mL/min (ref >60)
Glucose, Bld: 113 mg/dL — ABNORMAL HIGH (ref 70–99)
Potassium: 4 mmol/L (ref 3.5–5.1)
Sodium: 135 mmol/L (ref 135–145)
Total Bilirubin: 0.3 mg/dL (ref 0.3–1.2)
Total Protein: 7.7 g/dL (ref 6.5–8.1)

## 2022-06-05 LAB — CBC
HCT: 34.9 % — ABNORMAL LOW (ref 39.0–52.0)
Hemoglobin: 11.1 g/dL — ABNORMAL LOW (ref 13.0–17.0)
MCH: 30.5 pg (ref 26.0–34.0)
MCHC: 31.8 g/dL (ref 30.0–36.0)
MCV: 95.9 fL (ref 80.0–100.0)
Platelets: 370 10*3/uL (ref 150–400)
RBC: 3.64 MIL/uL — ABNORMAL LOW (ref 4.22–5.81)
RDW: 18.4 % — ABNORMAL HIGH (ref 11.5–15.5)
WBC: 10.4 10*3/uL (ref 4.0–10.5)
nRBC: 0 % (ref 0.0–0.2)

## 2022-06-05 LAB — CULTURE, BLOOD (ROUTINE X 2)

## 2022-06-05 NOTE — Progress Notes (Signed)
  Progress Note   Patient: Devon Wall. [id number]:[id number] DOB: [birth year] DOA: 06/02/2022     3 DOS: the patient was seen and examined on 06/05/2022   Brief hospital course: 71 y.o. male with medical history significant of arthritis, hypertension, history of multiple episodes of pneumonia, hyponatremia, malnutrition, former cigarette smoker who is coming to the emergency department due to feeling fatigue for several days and feels like when he has a pneumonia episode in the past. Admitted for R sided PNA  Assessment and Plan: No notes have been filed under this hospital service. Service: Hospitalist Principal Problem:   Sepsis due to undetermined organism POA (HCC) In the setting of:   CAP (community acquired pneumonia) With associated:   Acute respiratory failure with hypoxia (HCC) Continue ceftriaxone 1 g IVPB daily and azithromycin 500 mg IVPB daily. On minimal o2 support F/u blood cultures, thus far neg Reports feeling better today  Active Problems:   Acute urinary retention CT abdomen/pelvis reviewed, findings consistent with R sided PNA     Unintentional weight loss History of cigarette smoking. Given hypoxia and small infiltrate.  CTA chest and CT abd/pelvis reviewed, findings notable for PNA and emphysematous changes PT/OT consulted     Normocytic anemia Hemodynamically stable Hgb stable     Moderate protein malnutrition (HCC) Protein supplementation. Encourage PO intake as tolerated     Hyperglobulinemia     Hyperglycemia Random glucose stable     Hypertension Currently not on medications. Will defer antihypertensives while septic. BP remains stable        Subjective: Reports feeling better. Does feel generally weak  Physical Exam: Vitals:   06/05/22 1207 06/05/22 1243 06/05/22 1608 06/05/22 1609  BP: 101/70     Pulse: 100     Resp: 14     Temp: (!) 97.4 F (36.3 C)     TempSrc: Oral     SpO2: 96%  95% 95%  Weight:      Height:  6\' 2"  (1.88  m)     General exam: Awake, laying in bed, in nad Respiratory system: Normal respiratory effort, no wheezing Cardiovascular system: regular rate, s1, s2 Gastrointestinal system: Soft, nondistended, positive BS Central nervous system: CN2-12 grossly intact, strength intact Extremities: Perfused, no clubbing Skin: Normal skin turgor, no notable skin lesions seen Psychiatry: Mood normal // no visual hallucinations   Data Reviewed:  Labs reviewed: K 4.0, Cr 0.55  Family Communication: Pt in room, family not at bedside  Disposition: Status is: Inpatient Remains inpatient appropriate because: Severity of illness  Planned Discharge Destination: Home     Author: , MD 06/05/2022 4:50 PM  For on call review [email or web address].

## 2022-06-05 NOTE — Progress Notes (Signed)
PT Cancellation Note  Patient Details Name: Devon Wall. MRN: [id number] DOB: 02-20-1951   Cancelled Treatment:     spoke with  OT who was able to work with pt however still very limited activity tolerance and pt was not able to tolerate Pt assessment in addition. Will have to check on pt tomorrow.    Marella Bile 06/05/2022, 3:53 PM

## 2022-06-05 NOTE — Progress Notes (Signed)
PT Cancellation Note  Patient Details Name: Devon Wall. MRN: [id number] DOB: 02/26/1951   Cancelled Treatment:      Checked on pt this morning, curled up in bed stating he feels awful, achy, just  "bad". Nurse tech does state that he does ambulate and walk around in room , but too fast and a bit impulsive. Will check back in afternoon.   Marella Bile 06/05/2022, 9:24 AM

## 2022-06-05 NOTE — Evaluation (Signed)
Occupational Therapy Evaluation Patient Details Name: Devon Wall. MRN: [id number] DOB: 23-Apr-1951 Today's Date: 06/05/2022   History of Present Illness 71 y.o. male with medical history significant of arthritis, hypertension, history of multiple episodes of pneumonia, hyponatremia, malnutrition, former cigarette smoker. REports feeling fatigue for several days and feels like when he has a pneumonia episode in the past. Admitted for R sided PNA   Clinical Impression   Mr. Devon Wall is a 71 year old man who presents today with generalized weakness, poor activity tolerance, and impaired balance resulting in a sudden decline in functional abilities. Patient's brother is in the room and reports he was living his nephew before he got sick "he was just fine." He also reports the plan is to get him in to a boarding room but he is concerned about him using the bathroom on hisself. Patient presents with flat affect and minimal verbalizations. Hard to determine cognition at this time but brother did not appear to be concerned about affect. He required max encouragement to get out of bed and only last about 5 minutes before requesting to get back in the chair.Before getting up he stated he had soiled himself and thus required total assist to clean up. He sounded "junky" and therapist encouraged him to cough and sit up in the chair. His o2 sat was above 92%. He was returned to bed for comfort. Patient will benefit from skilled OT services while in hospital to improve deficits and learn compensatory strategies as needed in order to return to PLOF. At this time will need short term rehab at discharge.     Recommendations for follow up therapy are one component of a multi-disciplinary discharge planning process, led by the attending physician.  Recommendations may be updated based on patient status, additional functional criteria and insurance authorization.   Follow Up Recommendations  Skilled  nursing-short term rehab (<3 hours/day)    Assistance Recommended at Discharge Frequent or constant Supervision/Assistance  Patient can return home with the following A little help with walking and/or transfers;A lot of help with bathing/dressing/bathroom;Assistance with cooking/housework;Help with stairs or ramp for entrance;Assist for transportation;Direct supervision/assist for financial management;Direct supervision/assist for medications management    Functional Status Assessment  Patient has had a recent decline in their functional status and demonstrates the ability to make significant improvements in function in a reasonable and predictable amount of time.  Equipment Recommendations  None recommended by OT    Recommendations for Other Services       Precautions / Restrictions Precautions Precautions: Fall Precaution Comments: incontinent Restrictions Weight Bearing Restrictions: No      Mobility Bed Mobility Overal bed mobility: Needs Assistance Bed Mobility: Supine to Sit, Sit to Supine     Supine to sit: Mod assist, HOB elevated Sit to supine: Supervision        Transfers Overall transfer level: Needs assistance Equipment used: Rolling walker (2 wheels) Transfers: Sit to/from Stand, Bed to chair/wheelchair/BSC Sit to Stand: Min assist, From elevated surface     Step pivot transfers: Min assist     General transfer comment: Able to take steps to recliner with rolling walker.      Balance Overall balance assessment: Mild deficits observed, not formally tested                                         ADL either performed or assessed  with clinical judgement   ADL Overall ADL's : Needs assistance/impaired Eating/Feeding: Independent;Bed level   Grooming: Set up;Bed level   Upper Body Bathing: Moderate assistance;Bed level   Lower Body Bathing: Total assistance;Bed level   Upper Body Dressing : Moderate assistance;Bed level   Lower  Body Dressing: Total assistance;Bed level   Toilet Transfer: Minimal assistance;BSC/3in1;Rolling walker (2 wheels)   Toileting- Clothing Manipulation and Hygiene: Total assistance;Bed level       Functional mobility during ADLs: Minimal assistance;Rolling walker (2 wheels)       Vision   Vision Assessment?: No apparent visual deficits     Perception     Praxis      Pertinent Vitals/Pain Pain Assessment Pain Assessment: No/denies pain     Hand Dominance     Extremity/Trunk Assessment Upper Extremity Assessment Upper Extremity Assessment: Generalized weakness   Lower Extremity Assessment Lower Extremity Assessment: Generalized weakness   Cervical / Trunk Assessment Cervical / Trunk Assessment: Normal   Communication Communication Communication: No difficulties   Cognition Arousal/Alertness: Awake/alert Behavior During Therapy: Flat affect Overall Cognitive Status: Difficult to assess                                 General Comments: Patient's brother doesn't state or seem concerned at patient's affect and lack of motivation. Is more concerned about why he is "using the bathroom on himself." Patient himself does not provide many details and responses mostly minimal.     General Comments       Exercises     Shoulder Instructions      Home Living Family/patient expects to be discharged to:: Private residence Living Arrangements: Other relatives (nephew)                           Home Equipment: None   Additional Comments: Patient gives minimal details. Brother in room during evaluation and states he was living with his nephew but now he's going to set up him up at a "boarding room."      Prior Functioning/Environment Prior Level of Function : Independent/Modified Independent                        OT Problem List: Decreased strength;Decreased activity tolerance;Impaired balance (sitting and/or standing);Decreased  safety awareness;Decreased cognition;Decreased knowledge of use of DME or AE;Decreased knowledge of precautions;Cardiopulmonary status limiting activity      OT Treatment/Interventions: Self-care/ADL training;Therapeutic exercise;DME and/or AE instruction;Therapeutic activities;Cognitive remediation/compensation;Balance training;Patient/family education    OT Goals(Current goals can be found in the care plan section) Acute Rehab OT Goals OT Goal Formulation: Patient unable to participate in goal setting Time For Goal Achievement: 06/19/22 Potential to Achieve Goals: Good  OT Frequency: Min 2X/week    Co-evaluation              AM-PAC OT "6 Clicks" Daily Activity     Outcome Measure Help from another person eating meals?: None Help from another person taking care of personal grooming?: A Little Help from another person toileting, which includes using toliet, bedpan, or urinal?: Total Help from another person bathing (including washing, rinsing, drying)?: A Lot Help from another person to put on and taking off regular upper body clothing?: A Lot Help from another person to put on and taking off regular lower body clothing?: Total 6 Click Score: 13   End of Session Equipment  Utilized During Treatment: Rolling walker (2 wheels) Nurse Communication: Mobility status (lungs sound bad - needs IS, FV, breathing tx)  Activity Tolerance: Patient limited by fatigue Patient left: in bed;with call bell/phone within reach;with bed alarm set;with nursing/sitter in room  OT Visit Diagnosis: Muscle weakness (generalized) (M62.81)                Time: 0923-3007 OT Time Calculation (min): 20 min Charges:  OT General Charges $OT Visit: 1 Visit OT Evaluation $OT Eval Low Complexity: 1 Low  Rexine Gowens, OTR/L Acute Care Rehab Services  Office [phone] Pager: [phone]   Kelli Churn 06/05/2022, 3:36 PM

## 2022-06-06 LAB — COMPREHENSIVE METABOLIC PANEL
ALT: 26 U/L (ref 0–44)
AST: 32 U/L (ref 15–41)
Albumin: 2.7 g/dL — ABNORMAL LOW (ref 3.5–5.0)
Alkaline Phosphatase: 87 U/L (ref 38–126)
Anion gap: 8 (ref 5–15)
BUN: 23 mg/dL (ref 8–23)
CO2: 20 mmol/L — ABNORMAL LOW (ref 22–32)
Calcium: 8.8 mg/dL — ABNORMAL LOW (ref 8.9–10.3)
Chloride: 106 mmol/L (ref 98–111)
Creatinine, Ser: 0.54 mg/dL — ABNORMAL LOW (ref 0.61–1.24)
GFR, Estimated: >60 mL/min (ref >60)
Glucose, Bld: 99 mg/dL (ref 70–99)
Potassium: 4.2 mmol/L (ref 3.5–5.1)
Sodium: 134 mmol/L — ABNORMAL LOW (ref 135–145)
Total Bilirubin: 0.5 mg/dL (ref 0.3–1.2)
Total Protein: 8 g/dL (ref 6.5–8.1)

## 2022-06-06 LAB — CBC
HCT: 35.7 % — ABNORMAL LOW (ref 39.0–52.0)
Hemoglobin: 11.5 g/dL — ABNORMAL LOW (ref 13.0–17.0)
MCH: 29.9 pg (ref 26.0–34.0)
MCHC: 32.2 g/dL (ref 30.0–36.0)
MCV: 92.7 fL (ref 80.0–100.0)
Platelets: 372 10*3/uL (ref 150–400)
RBC: 3.85 MIL/uL — ABNORMAL LOW (ref 4.22–5.81)
RDW: 17.9 % — ABNORMAL HIGH (ref 11.5–15.5)
WBC: 10 10*3/uL (ref 4.0–10.5)
nRBC: 0 % (ref 0.0–0.2)

## 2022-06-06 LAB — CULTURE, BLOOD (ROUTINE X 2)

## 2022-06-06 NOTE — Care Management Important Message (Signed)
Important Message  Patient Details IM Letter given to the Patient. Name: Devon Wall. MRN: [id number] Date of Birth: November 14, 1951   Medicare Important Message Given:  Yes     Caren Macadam 06/06/2022, 1:01 PM

## 2022-06-06 NOTE — TOC Progression Note (Signed)
Transition of Care (TOC) - Progression Note    Patient Details  Name: Devon Wall. MRN: [id number] Date of Birth: 09/17/1951  Transition [facility]) CM/SW Contact  Golda Acre, RN Phone Number: 06/06/2022, 11:06 AM  Clinical Narrative:    Following for toc needs /last dose of iv abx scheduled for [id number] afternoon.   Expected Discharge Plan: Home/Self Care Barriers to Discharge: Continued Medical Work up  Expected Discharge Plan and Services Expected Discharge Plan: Home/Self Care   Discharge Planning Services: CM Consult   Living arrangements for the past 2 months: Single Family Home                                       Social Determinants of Health (SDOH) Interventions    Readmission Risk Interventions     No data to display

## 2022-06-06 NOTE — Progress Notes (Signed)
PT Cancellation Note  Patient Details Name: Devon Wall. MRN: [id number] DOB: 23-Jul-1951   Cancelled Treatment:    Reason Eval/Treat Not Completed: Patient declined, no reason specified, patient lying in bed, nonverbal, shaking head no to getting up. Shakes head yes in agreement to try this PM. Will check back  as schedule allows.  Blanchard Kelch PT Acute Rehabilitation Services Pager [phone] Office [phone]    Rada Hay 06/06/2022, 10:58 AM

## 2022-06-06 NOTE — Progress Notes (Signed)
  Progress Note   Patient: Devon Wall. [id number]:[id number] DOB: [birth year] DOA: 06/02/2022     4 DOS: the patient was seen and examined on 06/06/2022   Brief hospital course: 71 y.o. male with medical history significant of arthritis, hypertension, history of multiple episodes of pneumonia, hyponatremia, malnutrition, former cigarette smoker who is coming to the emergency department due to feeling fatigue for several days and feels like when he has a pneumonia episode in the past. Admitted for R sided PNA  Assessment and Plan: No notes have been filed under this hospital service. Service: Hospitalist Principal Problem:   Sepsis due to undetermined organism POA (HCC) In the setting of:   CAP (community acquired pneumonia) With associated:   Acute respiratory failure with hypoxia (HCC) Continue ceftriaxone 1 g IVPB daily and azithromycin 500 mg IVPB daily. On minimal o2 support F/u blood cultures, thus far neg More alert today  Active Problems:   Acute urinary retention CT abdomen/pelvis reviewed, findings consistent with R sided PNA Good urine output documented     Unintentional weight loss History of cigarette smoking. Given hypoxia and small infiltrate.  CTA chest and CT abd/pelvis reviewed, findings notable for PNA and emphysematous changes PT/OT consulted, recs for SNF     Normocytic anemia Hemodynamically stable Hgb has remained stable     Moderate protein malnutrition (HCC) Protein supplementation. Encourage PO intake as tolerated     Hyperglobulinemia     Hyperglycemia Random glucose stable     Hypertension Currently not on medications. BP remains stable        Subjective: More awake, still not very conversant. Reports feeling better  Physical Exam: Vitals:   06/05/22 1609 06/05/22 2012 06/06/22 0605 06/06/22 1233  BP:  108/77 100/75 93/68  Pulse:  (!) 106 93 98  Resp:  20 20   Temp:  98.7 F (37.1 C) 98.4 F (36.9 C) (!) 97.5 F (36.4 C)   TempSrc:    Axillary  SpO2: 95% 98% 97% 97%  Weight:      Height:       General exam: Conversant, in no acute distress Respiratory system: normal chest rise, clear, no audible wheezing Cardiovascular system: regular rhythm, s1-s2 Gastrointestinal system: Nondistended, nontender, pos BS Central nervous system: No seizures, no tremors Extremities: No cyanosis, no joint deformities Skin: No rashes, no pallor Psychiatry: Affect normal // no auditory hallucinations   Data Reviewed:  Labs reviewed: K 4.2, Cr 0.54  Family Communication: Pt in room, family not at bedside  Disposition: Status is: Inpatient Remains inpatient appropriate because: Severity of illness  Planned Discharge Destination: Home     Author: Rickey Barbara, MD 06/06/2022 2:47 PM  For on call review [email or web address].

## 2022-06-07 DIAGNOSIS — J9601 Acute respiratory failure with hypoxia: Secondary | ICD-10-CM

## 2022-06-07 LAB — CBC
HCT: 34.2 % — ABNORMAL LOW (ref 39.0–52.0)
Hemoglobin: 11 g/dL — ABNORMAL LOW (ref 13.0–17.0)
MCH: 30.6 pg (ref 26.0–34.0)
MCHC: 32.2 g/dL (ref 30.0–36.0)
MCV: 95.3 fL (ref 80.0–100.0)
Platelets: 329 10*3/uL (ref 150–400)
RBC: 3.59 MIL/uL — ABNORMAL LOW (ref 4.22–5.81)
RDW: 17.7 % — ABNORMAL HIGH (ref 11.5–15.5)
WBC: 9.9 10*3/uL (ref 4.0–10.5)
nRBC: 0 % (ref 0.0–0.2)

## 2022-06-07 LAB — COMPREHENSIVE METABOLIC PANEL
ALT: 27 U/L (ref 0–44)
AST: 32 U/L (ref 15–41)
Albumin: 2.6 g/dL — ABNORMAL LOW (ref 3.5–5.0)
Alkaline Phosphatase: 83 U/L (ref 38–126)
Anion gap: 8 (ref 5–15)
BUN: 25 mg/dL — ABNORMAL HIGH (ref 8–23)
CO2: 21 mmol/L — ABNORMAL LOW (ref 22–32)
Calcium: 8.6 mg/dL — ABNORMAL LOW (ref 8.9–10.3)
Chloride: 107 mmol/L (ref 98–111)
Creatinine, Ser: 0.61 mg/dL (ref 0.61–1.24)
GFR, Estimated: >60 mL/min (ref >60)
Glucose, Bld: 106 mg/dL — ABNORMAL HIGH (ref 70–99)
Potassium: 3.9 mmol/L (ref 3.5–5.1)
Sodium: 136 mmol/L (ref 135–145)
Total Bilirubin: 0.3 mg/dL (ref 0.3–1.2)
Total Protein: 8 g/dL (ref 6.5–8.1)

## 2022-06-07 LAB — CULTURE, BLOOD (ROUTINE X 2)
Culture: NO GROWTH
Culture: NO GROWTH

## 2022-06-07 NOTE — Plan of Care (Signed)
  Problem: Nutrition: Goal: Adequate nutrition will be maintained Outcome: Progressing   Problem: Pain Managment: Goal: General experience of comfort will improve Outcome: Progressing   

## 2022-06-07 NOTE — Evaluation (Signed)
Physical Therapy Evaluation Patient Details Name: Devon Wall. MRN: [id number] DOB: 08/29/1951 Today's Date: 06/07/2022  History of Present Illness  71 y.o. male with medical history significant of arthritis, hypertension, history of multiple episodes of pneumonia, hyponatremia, malnutrition, former cigarette smoker. REports feeling fatigue for several days and feels like when he has a pneumonia episode in the past. Admitted for R sided PNA  Clinical Impression  Pt admitted with above diagnosis. Pt ambulated 5' with RW, distance limited by fatigue. Pt was somewhat lethargic, he was inconsistent with responding to questions and commands. He stated he has no assistance available at home. He may need ST-SNF if family cannot provide assistance.  Pt currently with functional limitations due to the deficits listed below (see PT Problem List). Pt will benefit from skilled PT to increase their independence and safety with mobility to allow discharge to the venue listed below.          Recommendations for follow up therapy are one component of a multi-disciplinary discharge planning process, led by the attending physician.  Recommendations may be updated based on patient status, additional functional criteria and insurance authorization.  Follow Up Recommendations Skilled nursing-short term rehab (<3 hours/day)    Assistance Recommended at Discharge Intermittent Supervision/Assistance  Patient can return home with the following  A little help with walking and/or transfers;Assist for transportation;Help with stairs or ramp for entrance;Assistance with cooking/housework;A little help with bathing/dressing/bathroom    Equipment Recommendations Rolling walker (2 wheels)  Recommendations for Other Services       Functional Status Assessment Patient has had a recent decline in their functional status and demonstrates the ability to make significant improvements in function in a reasonable and  predictable amount of time.     Precautions / Restrictions Precautions Precautions: Fall Precaution Comments: incontinent Restrictions Weight Bearing Restrictions: No      Mobility  Bed Mobility Overal bed mobility: Needs Assistance Bed Mobility: Supine to Sit     Supine to sit: Mod assist, HOB elevated     General bed mobility comments: assist to raise trunk and advance BLEs    Transfers Overall transfer level: Needs assistance Equipment used: Rolling walker (2 wheels) Transfers: Sit to/from Stand Sit to Stand: Min assist, From elevated surface           General transfer comment: uncontrolled descent to recliner with stand to sit    Ambulation/Gait Ambulation/Gait assistance: Min assist, +2 safety/equipment, Min guard Gait Distance (Feet): 5 Feet   Gait Pattern/deviations: Step-through pattern, Decreased stride length, Trunk flexed       General Gait Details: VCs for proximity to RW and for posture, distance limited by fatigue, uncontrolled descent to recliner  Stairs            Wheelchair Mobility    Modified Rankin (Stroke Patients Only)       Balance Overall balance assessment: Mild deficits observed, not formally tested   Sitting balance-Leahy Scale: Fair     Standing balance support: Bilateral upper extremity supported Standing balance-Leahy Scale: Poor                               Pertinent Vitals/Pain Pain Assessment Pain Assessment: No/denies pain    Home Living Family/patient expects to be discharged to:: Private residence Living Arrangements: Other relatives (nephew)               Home Equipment: None Additional Comments: Patient minimally responsive  to questions, no family in room. Per OT eval, Brother stated he was living with his nephew but now he's going to set up him up at a "boarding room."    Prior Function Prior Level of Function : Independent/Modified Independent;Patient poor historian/Family not  available             Mobility Comments: walks without AD       Hand Dominance        Extremity/Trunk Assessment   Upper Extremity Assessment Upper Extremity Assessment: Generalized weakness    Lower Extremity Assessment Lower Extremity Assessment: Generalized weakness;Difficult to assess due to impaired cognition (did not follow commands for manual muscle testing)    Cervical / Trunk Assessment Cervical / Trunk Assessment: Kyphotic  Communication   Communication: No difficulties  Cognition Arousal/Alertness: Lethargic Behavior During Therapy: Flat affect Overall Cognitive Status: No family/caregiver present to determine baseline cognitive functioning                                 General Comments: pt opened eyes to verbal stimulation, very minimal responses (sometimes no response) to questions/commands        General Comments      Exercises     Assessment/Plan    PT Assessment Patient needs continued PT services  PT Problem List Decreased mobility;Decreased activity tolerance;Decreased balance;Decreased knowledge of use of DME       PT Treatment Interventions Functional mobility training;Therapeutic activities;Patient/family education;Therapeutic exercise;Balance training;Gait training    PT Goals (Current goals can be found in the Care Plan section)  Acute Rehab PT Goals PT Goal Formulation: Patient unable to participate in goal setting Time For Goal Achievement: 06/21/22 Potential to Achieve Goals: Fair    Frequency Min 2X/week     Co-evaluation               AM-PAC PT "6 Clicks" Mobility  Outcome Measure Help needed turning from your back to your side while in a flat bed without using bedrails?: A Little Help needed moving from lying on your back to sitting on the side of a flat bed without using bedrails?: A Lot Help needed moving to and from a bed to a chair (including a wheelchair)?: A Little Help needed standing up  from a chair using your arms (e.g., wheelchair or bedside chair)?: A Little Help needed to walk in hospital room?: A Little Help needed climbing 3-5 steps with a railing? : Total 6 Click Score: 15    End of Session Equipment Utilized During Treatment: Gait belt Activity Tolerance: Patient tolerated treatment well Patient left: in chair;with call bell/phone within reach (no chair alarm box in room, NT notified. Alarm pad placed under pt.) Nurse Communication: Mobility status PT Visit Diagnosis: Muscle weakness (generalized) (M62.81);Difficulty in walking, not elsewhere classified (R26.2);Other abnormalities of gait and mobility (R26.89)    Time: 9480-1655 PT Time Calculation (min) (ACUTE ONLY): 18 min   Charges:   PT Evaluation $PT Eval Moderate Complexity: 1 Mod          Tamala Ser PT 06/07/2022  Acute Rehabilitation Services Pager [phone] Office [phone]

## 2022-06-07 NOTE — Progress Notes (Signed)
  Progress Note   Patient: Devon Wall. [id number] DOB: Apr 17, 1951 DOA: 06/02/2022     5 DOS: the patient was seen and examined on 06/07/2022   Brief hospital course: 71 y.o. male with medical history significant of arthritis, hypertension, history of multiple episodes of pneumonia, hyponatremia, malnutrition, former cigarette smoker who is coming to the emergency department due to feeling fatigue for several days and feels like when he has a pneumonia episode in the past. Admitted for R sided PNA  Assessment and Plan: No notes have been filed under this hospital service. Service: Hospitalist Principal Problem:   Sepsis due to undetermined organism POA (HCC) In the setting of:   CAP (community acquired pneumonia) With associated:   Acute respiratory failure with hypoxia (HCC) Completed 5 day course of azithro and rocephin Blood cx thus far neg Now more alert and conversant Seen by PT, recs for SNF. TOC consulted  Active Problems:   Acute urinary retention CT abdomen/pelvis reviewed, findings consistent with R sided PNA Good urine output thus far     Unintentional weight loss History of cigarette smoking. Given hypoxia and small infiltrate.  CTA chest and CT abd/pelvis reviewed, findings notable for PNA and emphysematous changes PT/OT consulted, recs for SNF, TOC consulted     Normocytic anemia Hemodynamically stable Hgb has remained stable     Moderate protein malnutrition (HCC) Protein supplementation. Encourage PO intake as tolerated     Hyperglobulinemia     Hyperglycemia Random glucose stable     Hypertension Currently not on medications. BP remains stable        Subjective: No complaints this AM. More alert. Denies sob or chest pains  Physical Exam: Vitals:   06/06/22 1233 06/06/22 2222 06/07/22 0653 06/07/22 1246  BP: 93/68 107/76 104/78 109/84  Pulse: 98 (!) 108 92 100  Resp:  18 16 (!) 26  Temp: (!) 97.5 F (36.4 C)  98.7 F (37.1 C)  98.5 F (36.9 C)  TempSrc: Axillary  Oral Axillary  SpO2: 97% 96% 97% 97%  Weight:      Height:       General exam: Awake, laying in bed, in nad Respiratory system: Normal respiratory effort, no wheezing Cardiovascular system: regular rate, s1, s2 Gastrointestinal system: Soft, nondistended, positive BS Central nervous system: CN2-12 grossly intact, strength intact Extremities: Perfused, no clubbing Skin: Normal skin turgor, no notable skin lesions seen Psychiatry: Mood normal // no visual hallucinations   Data Reviewed:  Labs reviewed: K 3.9, Cr 0.61  Family Communication: Pt in room, family not at bedside  Disposition: Status is: Inpatient Remains inpatient appropriate because: Severity of illness  Planned Discharge Destination: Home     Author: Rickey Barbara, MD 06/07/2022 2:34 PM  For on call review [email or web address].

## 2022-06-08 DIAGNOSIS — D649 Anemia, unspecified: Secondary | ICD-10-CM

## 2022-06-08 DIAGNOSIS — J189 Pneumonia, unspecified organism: Secondary | ICD-10-CM

## 2022-06-08 DIAGNOSIS — E440 Moderate protein-calorie malnutrition: Secondary | ICD-10-CM

## 2022-06-08 LAB — COMPREHENSIVE METABOLIC PANEL
ALT: 25 U/L (ref 0–44)
AST: 29 U/L (ref 15–41)
Albumin: 2.8 g/dL — ABNORMAL LOW (ref 3.5–5.0)
Alkaline Phosphatase: 86 U/L (ref 38–126)
Anion gap: 8 (ref 5–15)
BUN: 18 mg/dL (ref 8–23)
CO2: 21 mmol/L — ABNORMAL LOW (ref 22–32)
Calcium: 8.8 mg/dL — ABNORMAL LOW (ref 8.9–10.3)
Chloride: 107 mmol/L (ref 98–111)
Creatinine, Ser: 0.47 mg/dL — ABNORMAL LOW (ref 0.61–1.24)
GFR, Estimated: >60 mL/min (ref >60)
Glucose, Bld: 113 mg/dL — ABNORMAL HIGH (ref 70–99)
Potassium: 3.7 mmol/L (ref 3.5–5.1)
Sodium: 136 mmol/L (ref 135–145)
Total Bilirubin: 0.5 mg/dL (ref 0.3–1.2)
Total Protein: 8.3 g/dL — ABNORMAL HIGH (ref 6.5–8.1)

## 2022-06-08 LAB — CBC
HCT: 35.2 % — ABNORMAL LOW (ref 39.0–52.0)
Hemoglobin: 11.3 g/dL — ABNORMAL LOW (ref 13.0–17.0)
MCH: 30.5 pg (ref 26.0–34.0)
MCHC: 32.1 g/dL (ref 30.0–36.0)
MCV: 95.1 fL (ref 80.0–100.0)
Platelets: 319 10*3/uL (ref 150–400)
RBC: 3.7 MIL/uL — ABNORMAL LOW (ref 4.22–5.81)
RDW: 17.5 % — ABNORMAL HIGH (ref 11.5–15.5)
WBC: 9.1 10*3/uL (ref 4.0–10.5)
nRBC: 0 % (ref 0.0–0.2)

## 2022-06-08 NOTE — NC FL2 (Signed)
[place] MEDICAID FL2 LEVEL OF CARE SCREENING TOOL     IDENTIFICATION  Patient Name: Devon Wall. Birthdate: 03-21-1951 Sex: male Admission Date (Current Location): 06/02/2022  [facility] and [place] Number:  Producer, television/film/video and Address:  [facility],  [address], [place] [address]      Provider Number: [id number]  Attending Physician Name and Address:  Briant Cedar, MD  Relative Name and Phone Number:       Current Level of Care: Hospital Recommended Level of Care: Skilled Nursing Facility Prior Approval Number:    Date Approved/Denied:   PASRR Number: [id number]  Discharge Plan: SNF    Current Diagnoses: Patient Active Problem List   Diagnosis Date Noted   Sepsis due to undetermined organism (HCC) 06/02/2022   Moderate protein malnutrition (HCC) 06/02/2022   Hyperglobulinemia 06/02/2022   Hyperglycemia 06/02/2022   Acute urinary retention 06/02/2022   Hypertension 06/02/2022   Unintentional weight loss 06/02/2022   Pneumonia 10/23/2016   CAP (community acquired pneumonia) 10/23/2016   Hyponatremia 10/23/2016   Normocytic anemia 10/23/2016   Malnutrition of moderate degree 11/12/2015   Lobar pneumonia, unspecified organism (HCC) 11/11/2015   Leukocytosis 11/10/2015   Sepsis due to pneumonia (HCC) 11/10/2015   Acute respiratory failure with hypoxia (HCC) 11/10/2015   Underweight 11/10/2015   Protein-calorie malnutrition, severe (HCC) 10/11/2014    Orientation RESPIRATION BLADDER Height & Weight     Self, Time, Situation, Place  Normal Continent Weight: 49.9 kg Height:  6\' 2"  (188 cm)  BEHAVIORAL SYMPTOMS/MOOD NEUROLOGICAL BOWEL NUTRITION STATUS      Continent Diet (regular)  AMBULATORY STATUS COMMUNICATION OF NEEDS Skin   Extensive Assist Verbally Normal                       Personal Care Assistance Level of Assistance  Bathing, Feeding, Dressing Bathing Assistance: Limited assistance Feeding assistance:  Limited assistance Dressing Assistance: Limited assistance     Functional Limitations Info  Sight, Hearing, Speech Sight Info: Adequate Hearing Info: Adequate Speech Info: Adequate    SPECIAL CARE FACTORS FREQUENCY  PT (By licensed PT), OT (By licensed OT)     PT Frequency: 5 x weekly OT Frequency: 5 x weekly            Contractures Contractures Info: Not present    Additional Factors Info  Code Status Code Status Info: full             Current Medications (06/08/2022):  This is the current hospital active medication list Current Facility-Administered Medications  Medication Dose Route Frequency Provider Last Rate Last Admin   acetaminophen (TYLENOL) tablet 650 mg  650 mg Oral Q6H PRN 06/10/2022, MD       Or   acetaminophen (TYLENOL) suppository 650 mg  650 mg Rectal Q6H PRN Bobette Mo, MD       albuterol (PROVENTIL) (2.5 MG/3ML) 0.083% nebulizer solution 2.5 mg  2.5 mg Nebulization Q4H PRN Bobette Mo, MD       Chlorhexidine Gluconate Cloth 2 % PADS 6 each  6 each Topical Daily Bobette Mo, MD   6 each at 06/08/22 0836   enoxaparin (LOVENOX) injection 40 mg  40 mg Subcutaneous Q24H 06/10/22, MD   40 mg at 06/07/22 2215   ondansetron [facility]) tablet 4 mg  4 mg Oral Q6H PRN [facility], MD       Or   ondansetron [facility]) injection  4 mg  4 mg Intravenous Q6H PRN Bobette Mo, MD         Discharge Medications: Please see discharge summary for a list of discharge medications.  Relevant Imaging Results:  Relevant Lab Results:   Additional Information SSN:[id number]  Golda Acre, RN

## 2022-06-08 NOTE — Progress Notes (Signed)
  Progress Note   Patient: Devon Wall. FUX:[id number] DOB: [birth year] DOA: 06/02/2022     6 DOS: the patient was seen and examined on 06/08/2022   Brief hospital course: 71 y.o. male with medical history significant of arthritis, hypertension, history of multiple episodes of pneumonia, hyponatremia, malnutrition, former cigarette smoker who is coming to the emergency department due to feeling fatigue for several days and feels like when he has a pneumonia episode in the past. Admitted for R sided PNA  Assessment and Plan:  Principal Problem:   Sepsis due to undetermined organism POA (HCC) In the setting of:   CAP (community acquired pneumonia) With associated:   Acute respiratory failure with hypoxia (HCC) Completed 5 day course of azithro and rocephin Blood cx thus far neg Now more alert and conversant Seen by PT, recs for SNF. TOC consulted  Active Problems: Acute urinary retention S/p foley placement Plan for voiding trial   Unintentional weight loss History of cigarette smoking. Given hypoxia and small infiltrate.  CTA chest and CT abd/pelvis reviewed, findings notable for PNA and emphysematous changes PT/OT consulted, recs for SNF, TOC consulted   Normocytic anemia Hgb has remained stable   Moderate protein malnutrition (HCC) Protein supplementation. Encourage PO intake as tolerated   Hypertension Currently not on medications. BP remains stable   Malnutrition BMI 14 Consulted    Subjective:  Patient seen and examined at bedside.  Meds patient on chair, requesting to be placed back on bed.  Denies any other new complaints    Physical Exam: Vitals:   06/07/22 1246 06/07/22 2040 06/08/22 0428 06/08/22 1302  BP: 109/84 114/81 97/71 105/72  Pulse: 100 (!) 102 95 94  Resp: (!) 26 20 18  (!) 24  Temp: 98.5 F (36.9 C) 98.4 F (36.9 C) 97.9 F (36.6 C) 98.2 F (36.8 C)  TempSrc: Axillary Oral  Oral  SpO2: 97% 97% 96% 98%  Weight:      Height:         General: NAD, appears malnourished Cardiovascular: S1, S2 present Respiratory: CTAB Abdomen: Soft, nontender, nondistended, bowel sounds present Musculoskeletal: No bilateral pedal edema noted Skin: Normal Psychiatry: Normal mood    Data Reviewed:    Family Communication: None at bedside  Disposition: Status is: Inpatient Remains inpatient appropriate because: Severity of illness  Planned Discharge Destination: SNF   Author: , MD 06/08/2022 6:15 PM  For on call review [email or web address].

## 2022-06-08 NOTE — TOC Progression Note (Addendum)
Transition of Care (TOC) - Progression Note    Patient Details  Name: Devon Wall. MRN: [id number] Date of Birth: 1951-09-14  Transition of [facility]) CM/SW Contact  Golda Acre, RN Phone Number: 06/08/2022, 9:29 AM  Clinical Narrative:    Patient information obtained for snf placement and fl2 sent out to area snf's for review. Patient has chosen [employer] care for [employer].  Facility made aware.  Expected Discharge Plan: Skilled Nursing Facility Barriers to Discharge: Continued Medical Work up  Expected Discharge Plan and Services Expected Discharge Plan: Skilled Nursing Facility   Discharge Planning Services: CM Consult   Living arrangements for the past 2 months: Single Family Home                                       Social Determinants of Health (SDOH) Interventions    Readmission Risk Interventions     No data to display

## 2022-06-08 NOTE — Progress Notes (Signed)
Occupational Therapy Treatment Patient Details Name: Devon Wall. MRN: [id number] DOB: May 11, 1951 Today's Date: 06/08/2022   History of present illness 71 y.o. male with medical history significant of arthritis, hypertension, history of multiple episodes of pneumonia, hyponatremia, malnutrition, former cigarette smoker. REports feeling fatigue for several days and feels like when he has a pneumonia episode in the past. Admitted for R sided PNA       Recommendations for follow up therapy are one component of a multi-disciplinary discharge planning process, led by the attending physician.  Recommendations may be updated based on patient status, additional functional criteria and insurance authorization.    Follow Up Recommendations  Skilled nursing-short term rehab (<3 hours/day)       Patient can return home with the following  A little help with walking and/or transfers;A lot of help with bathing/dressing/bathroom;Assistance with cooking/housework;Help with stairs or ramp for entrance;Assist for transportation;Direct supervision/assist for financial management;Direct supervision/assist for medications management         Precautions / Restrictions Precautions Precautions: Fall Precaution Comments: incontinent Restrictions Weight Bearing Restrictions: No       Mobility Bed Mobility Overal bed mobility: Needs Assistance Bed Mobility: Supine to Sit     Supine to sit: Mod assist, HOB elevated     General bed mobility comments: assist to raise trunk and advance BLEs    Transfers Overall transfer level: Needs assistance Equipment used: Rolling walker (2 wheels) Transfers: Sit to/from Stand Sit to Stand: From elevated surface, Mod assist     Step pivot transfers: Mod assist           Balance Overall balance assessment: Mild deficits observed, not formally tested   Sitting balance-Leahy Scale: Fair     Standing balance support: Bilateral upper extremity  supported Standing balance-Leahy Scale: Poor                             ADL either performed or assessed with clinical judgement   ADL   Eating/Feeding: Independent;Sitting   Grooming: Set up;Sitting   Upper Body Bathing: Moderate assistance;Sitting   Lower Body Bathing: Maximal assistance   Upper Body Dressing : Minimal assistance;Sitting       Toilet Transfer: BSC/3in1;Rolling walker (2 wheels);Moderate assistance                  Extremity/Trunk Assessment Upper Extremity Assessment Upper Extremity Assessment: Generalized weakness             Cognition Arousal/Alertness: Awake/alert Behavior During Therapy: Flat affect Overall Cognitive Status: No family/caregiver present to determine baseline cognitive functioning                                            Frequency  Min 2X/week        Progress Toward Goals  OT Goals(current goals can now be found in the care plan section)  Progress towards OT goals: Progressing toward goals     Plan Discharge plan remains appropriate       AM-PAC OT "6 Clicks" Daily Activity     Outcome Measure   Help from another person eating meals?: None Help from another person taking care of personal grooming?: A Little Help from another person toileting, which includes using toliet, bedpan, or urinal?: A Lot Help from another person bathing (including washing, rinsing, drying)?: A Lot  Help from another person to put on and taking off regular upper body clothing?: A Lot Help from another person to put on and taking off regular lower body clothing?: A Lot 6 Click Score: 15    End of Session Equipment Utilized During Treatment: Gait belt;Rolling walker (2 wheels)  OT Visit Diagnosis: Muscle weakness (generalized) (M62.81)   Activity Tolerance Patient tolerated treatment well   Patient Left with call bell/phone within reach;with nursing/sitter in room;in chair (no chair alarm box in  roo64m.  CNA aware and finding one for pt)   Nurse Communication Mobility status        Time: 0940-1009 OT Time Calculation (min): 29 min  Charges: OT General Charges $OT Visit: 1 Visit OT Treatments $Self Care/Home Management : 23-37 mins  Lise Auer, OT Acute Rehabilitation Services [phone] Office- [phone]     [name], [name] 06/08/2022, 1:18 PM

## 2022-06-09 LAB — CREATININE, SERUM
Creatinine, Ser: 0.55 mg/dL — ABNORMAL LOW (ref 0.61–1.24)
GFR, Estimated: >60 mL/min (ref >60)

## 2022-06-09 MED ORDER — ENSURE ENLIVE PO LIQD
237.0000 mL | Freq: Three times a day (TID) | ORAL | Status: DC
  Administered 2022-06-09 – 2022-06-10 (×4): 237 mL via ORAL

## 2022-06-09 NOTE — Progress Notes (Signed)
Pt was DTV by 0600.  He could not void.  Bladder scan showed .  I & O cath per order and out.

## 2022-06-09 NOTE — Progress Notes (Signed)
Intermittent catheter at 1815 returned 550 mL urine.  Patient c/o severe discomfort with procedure.  MD aware.  Bradd Burner, RN

## 2022-06-09 NOTE — Care Management Important Message (Signed)
Important Message  Patient Details IM Letter given to the Patient. Name: Devon Wall. MRN: [id number] Date of Birth: [birth year]   Medicare Important Message Given:  Yes     Caren Macadam 06/09/2022, 12:32 PM

## 2022-06-09 NOTE — Progress Notes (Signed)
PT Cancellation Note  Patient Details Name: Devon Wall. MRN: [id number] DOB: 11/23/1951   Cancelled Treatment:    Reason Eval/Treat Not Completed: Fatigue/lethargy limiting ability to participate (pt stated he's too tired to get out of bed. Benefits of mobility and risks of bedrest were explained to pt, he continued to refused mobility. Will follow.)   Tamala Ser PT 06/09/2022  Acute Rehabilitation Services  Office [phone]

## 2022-06-09 NOTE — Progress Notes (Signed)
Bladder scan revealed 550 urine.  Intermittent  catheter performed per order, 550 clear yellow urine returned.  Patient tolerated fair.  Bradd Burner, RN

## 2022-06-09 NOTE — Progress Notes (Signed)
  Progress Note   Patient: Devon Wall. KYH:[id number] DOB: Apr 19, 1951 DOA: 06/02/2022     7 DOS: the patient was seen and examined on 06/09/2022   Brief hospital course: 70 y.o. male with medical history significant of arthritis, hypertension, history of multiple episodes of pneumonia, hyponatremia, malnutrition, former cigarette smoker who is coming to the emergency department due to feeling fatigue for several days and feels like when he has a pneumonia episode in the past. Admitted for R sided PNA  Assessment and Plan:  Principal Problem:   Sepsis due to undetermined organism POA (HCC) In the setting of:   CAP (community acquired pneumonia) With associated:   Acute respiratory failure with hypoxia (HCC) Completed 5 day course of azithro and rocephin Blood cx thus far neg Now more alert and conversant Seen by PT, recs for SNF. TOC consulted  Active Problems: Acute urinary retention S/p foley placement on admission, attempted voiding trial on 6/21, remove Foley on 6/21, since required 2 times In-N-Out, may require to be discharged to SNF with Foley and urology outpatient follow-up   Unintentional weight loss History of cigarette smoking. Given hypoxia and small infiltrate.  CTA chest and CT abd/pelvis reviewed, findings notable for PNA and emphysematous changes PT/OT consulted, recs for SNF, TOC consulted   Normocytic anemia Hgb has remained stable   Moderate protein malnutrition (HCC) Protein supplementation. Encourage PO intake as tolerated   Hypertension Currently not on medications. BP remains stable   Malnutrition BMI 14 Consulted    Subjective:  Patient seen and examined at bedside.  Denies any new complaints.  Reports being very tired.    Physical Exam: Vitals:   06/08/22 0428 06/08/22 1302 06/08/22 2126 06/09/22 0426  BP: 97/71 105/72 108/78 99/75  Pulse: 95 94 94 91  Resp: 18 (!) 24 (!) 22 18  Temp: 97.9 F (36.6 C) 98.2 F (36.8 C) 97.8 F  (36.6 C) 98.1 F (36.7 C)  TempSrc:  Oral Oral Oral  SpO2: 96% 98% 100% 98%  Weight:      Height:        General: NAD, appears malnourished Cardiovascular: S1, S2 present Respiratory: CTAB Abdomen: Soft, nontender, nondistended, bowel sounds present Musculoskeletal: No bilateral pedal edema noted Skin: Normal Psychiatry: Normal mood    Data Reviewed:    Family Communication: None at bedside  Disposition: Status is: Inpatient Remains inpatient appropriate because: Severity of illness  Planned Discharge Destination: SNF   Author: Briant Cedar, MD 06/09/2022 3:40 PM  For on call review [email or web address].

## 2022-06-10 DIAGNOSIS — R634 Abnormal weight loss: Secondary | ICD-10-CM

## 2022-06-10 MED ORDER — ENSURE ENLIVE PO LIQD: 237.0000 mL | Freq: Three times a day (TID) | ORAL | 12 refills | Status: AC

## 2022-06-10 NOTE — TOC Progression Note (Addendum)
Transition of Care (TOC) - Progression Note    Patient Details  Name: Devon Wall. MRN: [id number] Date of Birth: Jan 10, 1951  Transition of Care [facility]) CM/SW Contact  Golda Acre, RN Phone Number: 06/10/2022, 8:31 AM  Clinical Narrative:    Awaiting dc to [facility].  Has foley in place due to urinary retention. Ptar called for transport.  Expected Discharge Plan: Skilled Nursing Facility Barriers to Discharge: Continued Medical Work up  Expected Discharge Plan and Services Expected Discharge Plan: Skilled Nursing Facility   Discharge Planning Services: CM Consult   Living arrangements for the past 2 months: Single Family Home                                       Social Determinants of Health (SDOH) Interventions    Readmission Risk Interventions     No data to display

## 2023-01-02 ENCOUNTER — Encounter (HOSPITAL_COMMUNITY): Payer: Self-pay

## 2023-01-02 ENCOUNTER — Other Ambulatory Visit: Payer: Self-pay

## 2023-01-02 ENCOUNTER — Emergency Department (HOSPITAL_COMMUNITY): Payer: Self-pay

## 2023-01-02 ENCOUNTER — Emergency Department (HOSPITAL_COMMUNITY)
Admission: EM | Admit: 2023-01-02 | Discharge: 2023-01-03 | Disposition: A | Discharge status: Home / Self Care | Payer: Self-pay | Attending: Emergency Medicine | Admitting: Emergency Medicine

## 2023-01-02 DIAGNOSIS — N3090 Cystitis, unspecified without hematuria: Secondary | ICD-10-CM

## 2023-01-02 DIAGNOSIS — H547 Unspecified visual loss: Secondary | ICD-10-CM

## 2023-01-02 DIAGNOSIS — I10 Essential (primary) hypertension: Secondary | ICD-10-CM | POA: Insufficient documentation

## 2023-01-02 LAB — APTT: aPTT: 35 seconds (ref 24–36)

## 2023-01-02 LAB — DIFFERENTIAL
Abs Immature Granulocytes: 0.01 10*3/uL (ref 0.00–0.07)
Basophils Absolute: 0 10*3/uL (ref 0.0–0.1)
Basophils Relative: 1 %
Eosinophils Absolute: 0.2 10*3/uL (ref 0.0–0.5)
Eosinophils Relative: 3 %
Immature Granulocytes: 0 %
Lymphocytes Relative: 39 %
Lymphs Abs: 2.3 10*3/uL (ref 0.7–4.0)
Monocytes Absolute: 0.8 10*3/uL (ref 0.1–1.0)
Monocytes Relative: 13 %
Neutro Abs: 2.5 10*3/uL (ref 1.7–7.7)
Neutrophils Relative %: 44 %

## 2023-01-02 LAB — CBC
HCT: 45.9 % (ref 39.0–52.0)
Hemoglobin: 14.9 g/dL (ref 13.0–17.0)
MCH: 30 pg (ref 26.0–34.0)
MCHC: 32.5 g/dL (ref 30.0–36.0)
MCV: 92.4 fL (ref 80.0–100.0)
Platelets: 173 10*3/uL (ref 150–400)
RBC: 4.97 MIL/uL (ref 4.22–5.81)
RDW: 14.6 % (ref 11.5–15.5)
WBC: 5.7 10*3/uL (ref 4.0–10.5)
nRBC: 0 % (ref 0.0–0.2)

## 2023-01-02 LAB — URINALYSIS, ROUTINE W REFLEX MICROSCOPIC
Bilirubin Urine: NEGATIVE
Glucose, UA: NEGATIVE mg/dL
Ketones, ur: NEGATIVE mg/dL
Nitrite: POSITIVE — AB
Protein, ur: 100 mg/dL — AB
RBC / HPF: >50 RBC/hpf — ABNORMAL HIGH (ref 0–5)
Specific Gravity, Urine: 1.025 (ref 1.005–1.030)
WBC, UA: >50 WBC/hpf — ABNORMAL HIGH (ref 0–5)
pH: 5 (ref 5.0–8.0)

## 2023-01-02 LAB — COMPREHENSIVE METABOLIC PANEL
ALT: 93 U/L — ABNORMAL HIGH (ref 0–44)
AST: 100 U/L — ABNORMAL HIGH (ref 15–41)
Albumin: 3.6 g/dL (ref 3.5–5.0)
Alkaline Phosphatase: 113 U/L (ref 38–126)
Anion gap: 8 (ref 5–15)
BUN: 16 mg/dL (ref 8–23)
CO2: 26 mmol/L (ref 22–32)
Calcium: 9.1 mg/dL (ref 8.9–10.3)
Chloride: 103 mmol/L (ref 98–111)
Creatinine, Ser: 0.82 mg/dL (ref 0.61–1.24)
GFR, Estimated: >60 mL/min (ref >60)
Glucose, Bld: 118 mg/dL — ABNORMAL HIGH (ref 70–99)
Potassium: 4.2 mmol/L (ref 3.5–5.1)
Sodium: 137 mmol/L (ref 135–145)
Total Bilirubin: 0.4 mg/dL (ref 0.3–1.2)
Total Protein: 8.7 g/dL — ABNORMAL HIGH (ref 6.5–8.1)

## 2023-01-02 LAB — PROTIME-INR
INR: 1 (ref 0.8–1.2)
Prothrombin Time: 13 seconds (ref 11.4–15.2)

## 2023-01-02 LAB — RAPID URINE DRUG SCREEN, HOSP PERFORMED
Amphetamines: NOT DETECTED
Barbiturates: NOT DETECTED
Benzodiazepines: NOT DETECTED
Cocaine: NOT DETECTED
Opiates: NOT DETECTED
Tetrahydrocannabinol: NOT DETECTED

## 2023-01-02 LAB — ETHANOL: Alcohol, Ethyl (B): <10 mg/dL (ref <10)

## 2023-01-02 MED ORDER — CIPROFLOXACIN HCL 500 MG PO TABS: 500.0000 mg | ORAL_TABLET | Freq: Two times a day (BID) | ORAL | 0 refills | Status: AC

## 2023-01-02 MED ORDER — CIPROFLOXACIN HCL 500 MG PO TABS
500.0000 mg | ORAL_TABLET | Freq: Once | ORAL | Status: AC
  Administered 2023-01-02: 500 mg via ORAL
  Filled 2023-01-02: qty 1

## 2023-01-02 MED ORDER — FLUCONAZOLE 200 MG PO TABS
200.0000 mg | ORAL_TABLET | Freq: Once | ORAL | Status: AC
  Administered 2023-01-02: 200 mg via ORAL
  Filled 2023-01-02: qty 1

## 2023-01-02 MED ORDER — FLUCONAZOLE 100 MG PO TABS: 100.0000 mg | ORAL_TABLET | Freq: Every day | ORAL | 0 refills | Status: AC

## 2023-01-02 MED ORDER — TETRACAINE HCL 0.5 % OP SOLN
1.0000 [drp] | Freq: Once | OPHTHALMIC | Status: AC
  Administered 2023-01-02: 1 [drp] via OPHTHALMIC
  Filled 2023-01-02: qty 4

## 2023-01-02 NOTE — Discharge Instructions (Addendum)
You will need to see an eye doctor soon as possible.  Dr. Stacie Glaze is expecting you at her office at 8 AM this morning.  Her address is below.  Your urine showed evidence of bacterial and yeast infection.  Antibiotics and antifungal medication were sent to your pharmacy.  Take these as prescribed.  You also need to follow-up with urology when possible.  Call the telephone number for [facility] tomorrow to set up an appointment.  When you see them, they can discuss results of urine culture with you.  They can also discuss long-term plans regarding your prior urine retention.

## 2023-01-02 NOTE — ED Provider Notes (Signed)
[facility] Provider Note   CSN: [id number] Arrival date & time: 01/02/23  1348     History  Chief Complaint  Patient presents with   Eye Problem    Devon Wall. is a 72 y.o. male.   Eye Problem Patient presents for visual disturbances.  Medical history includes malnutrition, HTN, arthritis.  Patient does not wear corrective lenses at baseline.  Over the past 3 weeks, he has had decreased visual acuity from his left eye.  He denies any associated pain.  He does endorse a mild feeling of "irritation" to his left eye.  This morning, he had an acute worsening of his left eye vision.  Patient seems diminished in lower visual fields.  He denies any other associated symptoms, including any numbness or weakness.      Home Medications Prior to Admission medications   Medication Sig Start Date End Date Taking? Authorizing Provider  ciprofloxacin (CIPRO) 500 MG tablet Take 1 tablet (500 mg total) by mouth every 12 (twelve) hours for 10 days. 01/02/23 01/12/23 Yes Gloris Manchester, MD  fluconazole (DIFLUCAN) 100 MG tablet Take 1 tablet (100 mg total) by mouth daily for 5 days. 01/02/23 01/07/23 Yes Gloris Manchester, MD  albuterol (PROVENTIL HFA;VENTOLIN HFA) 108 (90 BASE) MCG/ACT inhaler Inhale 2 puffs into the lungs every 6 (six) hours as needed for wheezing or shortness of breath. Patient not taking: Reported on 10/23/2016 10/13/14   Dhungel, Theda Belfast, MD  feeding supplement (ENSURE ENLIVE / ENSURE PLUS) LIQD Take 237 mLs by mouth 3 (three) times daily between meals. 06/10/22   Briant Cedar, MD      Allergies    Patient has no known allergies.    Review of Systems   Review of Systems  Eyes:  Positive for visual disturbance.  All other systems reviewed and are negative.   Physical Exam Updated Vital Signs BP (!) 120/95 (BP Location: Right Arm)   Pulse 80   Temp 98.2 F (36.8 C) (Oral)   Resp 18   Ht 6\' 1"  (1.854 m)   Wt 72.6 kg   SpO2 95%    BMI 21.11 kg/m  Physical Exam Vitals and nursing note reviewed.  Constitutional:      General: He is not in acute distress.    Appearance: Normal appearance. He is well-developed. He is not ill-appearing, toxic-appearing or diaphoretic.  HENT:     Head: Normocephalic and atraumatic.     Right Ear: External ear normal.     Left Ear: External ear normal.     Nose: Nose normal.     Mouth/Throat:     Mouth: Mucous membranes are moist.  Eyes:     General: Visual field deficit present. No scleral icterus.       Right eye: No discharge.        Left eye: No discharge.     Extraocular Movements: Extraocular movements intact.     Conjunctiva/sclera: Conjunctivae normal.     Comments: On ocular ultrasound, no clear evidence of retinal detachment.  There is echogenic debris present in vitreous chamber. IOP right eye 26, IOP left eye 32  Cardiovascular:     Rate and Rhythm: Normal rate and regular rhythm.  Pulmonary:     Effort: Pulmonary effort is normal. No respiratory distress.  Abdominal:     General: There is no distension.     Palpations: Abdomen is soft.     Tenderness: There is no abdominal tenderness.  Musculoskeletal:  General: No swelling.     Cervical back: Normal range of motion and neck supple.     Right lower leg: No edema.     Left lower leg: No edema.  Skin:    General: Skin is warm and dry.     Capillary Refill: Capillary refill takes less than 2 seconds.     Coloration: Skin is not jaundiced or pale.  Neurological:     Mental Status: He is alert and oriented to person, place, and time.     Cranial Nerves: No dysarthria or facial asymmetry.     Sensory: Sensation is intact. No sensory deficit.     Motor: Motor function is intact. No weakness or pronator drift.     Coordination: Coordination is intact.  Psychiatric:        Mood and Affect: Mood normal.        Behavior: Behavior normal.        Thought Content: Thought content normal.        Judgment: Judgment  normal.     ED Results / Procedures / Treatments   Labs (all labs ordered are listed, but only abnormal results are displayed) Labs Reviewed  COMPREHENSIVE METABOLIC PANEL - Abnormal; Notable for the following components:      Result Value   Glucose, Bld 118 (*)    Total Protein 8.7 (*)    AST 100 (*)    ALT 93 (*)    All other components within normal limits  URINALYSIS, ROUTINE W REFLEX MICROSCOPIC - Abnormal; Notable for the following components:   APPearance CLOUDY (*)    Hgb urine dipstick LARGE (*)    Protein, ur 100 (*)    Nitrite POSITIVE (*)    Leukocytes,Ua MODERATE (*)    RBC / HPF >50 (*)    WBC, UA >50 (*)    Bacteria, UA MANY (*)    All other components within normal limits  URINE CULTURE  PROTIME-INR  APTT  CBC  DIFFERENTIAL  ETHANOL  RAPID URINE DRUG SCREEN, HOSP PERFORMED    EKG EKG Interpretation  Date/Time:  Monday January 02 2023 15:18:04 EST Ventricular Rate:  82 PR Interval:  176 QRS Duration: 88 QT Interval:  371 QTC Calculation: 434 R Axis:   -3 Text Interpretation: Sinus rhythm Borderline T abnormalities, lateral leads Confirmed by Gloris Manchester [phone]) on 01/02/2023 9:43:39 PM  Radiology MR Brain Wo Contrast (neuro protocol)  Result Date: 01/02/2023 CLINICAL DATA:  Neuro deficit, stroke suspected. Vision changes for 3 weeks worsened this morning. Left facial tingling. EXAM: MRI HEAD WITHOUT CONTRAST TECHNIQUE: Multiplanar, multiecho pulse sequences of the brain and surrounding structures were obtained without intravenous contrast. COMPARISON:  Same-day head CT. FINDINGS: Brain: There is no acute intracranial hemorrhage, extra-axial fluid collection, or acute infarct Parenchymal volume is normal for age. The ventricles are normal in size. Gray-white differentiation is preserved. Patchy FLAIR signal abnormality in the supratentorial white matter likely reflects sequela of mild chronic small-vessel ischemic change The pituitary and suprasellar region  are normal. There is no mass lesion. There is no mass effect or midline shift. Vascular: Normal flow voids. Skull and upper cervical spine: Normal marrow signal. Sinuses/Orbits: The paranasal sinuses are clear. The globes and orbits are unremarkable. Other: None. IMPRESSION: No acute intracranial pathology. Electronically Signed   By: Lesia Hausen M.D.   On: 01/02/2023 19:22   CT HEAD WO CONTRAST  Result Date: 01/02/2023 CLINICAL DATA:  Neuro deficit, acute, stroke suspected. Vision changes for 3  weeks, worse this morning and now with associated left facial tingling. EXAM: CT HEAD WITHOUT CONTRAST TECHNIQUE: Contiguous axial images were obtained from the base of the skull through the vertex without intravenous contrast. RADIATION DOSE REDUCTION: This exam was performed according to the departmental dose-optimization program which includes automated exposure control, adjustment of the mA and/or kV according to patient size and/or use of iterative reconstruction technique. COMPARISON:  Head CT 04/08/2018 FINDINGS: Brain: There is no evidence of an acute infarct, intracranial hemorrhage, mass, midline shift, or extra-axial fluid collection. The ventricles and sulci are within normal limits for age. Cerebral white matter hypodensities are nonspecific but compatible with mild chronic small vessel ischemic disease. Vascular: No hyperdense vessel. Skull: No acute fracture or suspicious osseous lesion. Sinuses/Orbits: Visualized paranasal sinuses and mastoid air cells are clear. Unremarkable included orbits. Other: None. IMPRESSION: 1. No evidence of acute intracranial abnormality. 2. Mild chronic small vessel ischemic disease. Electronically Signed   By: Logan Bores M.D.   On: 01/02/2023 15:29   DG Chest 2 View  Result Date: 01/02/2023 CLINICAL DATA:  Confusion and vision changes. EXAM: CHEST - 2 VIEW COMPARISON:  06/02/2022, 12/29/2016, 10/23/2016, CT 06/02/2022 FINDINGS: Lungs are somewhat hyperexpanded with  findings suggesting COPD. Several scattered calcified granulomas over the upper lungs. No focal airspace process or effusion. Minimal left basilar scarring. Cardiomediastinal silhouette and remainder of the exam is unchanged. IMPRESSION: 1. No acute cardiopulmonary disease. 2. COPD. Electronically Signed   By: Marin Olp M.D.   On: 01/02/2023 15:01    Procedures Procedures    Medications Ordered in ED Medications  tetracaine (PONTOCAINE) 0.5 % ophthalmic solution 1 drop (1 drop Both Eyes Given 01/02/23 2025)  ciprofloxacin (CIPRO) tablet 500 mg (500 mg Oral Given 01/02/23 2135)  fluconazole (DIFLUCAN) tablet 200 mg (200 mg Oral Given 01/02/23 2214)    ED Course/ Medical Decision Making/ A&P                             Medical Decision Making Amount and/or Complexity of Data Reviewed Labs: ordered.  Risk Prescription drug management.   This patient presents to the ED for concern of left eye visual loss, this involves an extensive number of treatment options, and is a complaint that carries with it a high risk of complications and morbidity.  The differential diagnosis includes cranial, CRVO, glaucoma, retinal detachment, vitreous hemorrhage, CVA   Co morbidities that complicate the patient evaluation  malnutrition, HTN, arthritis   Additional history obtained:  Additional history obtained from N/A External records from outside source obtained and reviewed including EMR   Lab Tests:  I Ordered, and personally interpreted labs.  The pertinent results include: Serum lab work is unremarkable.  Urinalysis shows nitrite positive bacteria, pyuria, and candiduria   Imaging Studies ordered:  I ordered imaging studies including chest x-ray, CT head, MRI brain I independently visualized and interpreted imaging which showed no acute findings I agree with the radiologist interpretation   Cardiac Monitoring: / EKG:  The patient was maintained on a cardiac monitor.  I personally  viewed and interpreted the cardiac monitored which showed an underlying rhythm of: Sinus rhythm   Consultations Obtained:  I requested consultation with the ophthalmologist, Dr. Stacie Glaze,  and discussed lab and imaging findings as well as pertinent plan - they recommend: Outpatient follow-up first thing in the morning   Problem List / ED Course / Critical interventions / Medication management  Patient presents  for pain since decreased visual acuity to his left eye.  He states that he has had ongoing diminished visual acuity over the last 3 weeks which acutely worsened upon awakening this morning.  Prior to being bedded in the ED, diagnostic workup was initiated.  CT head showed no acute findings.  MRI brain was ordered.  On assessment, patient is resting comfortably.  He denies any new symptoms in his right eye.  He has decreased visual acuity from his left eye.  Patient is able to count fingers and upper visual fields.  He is not able to count fingers and lower visual fields but is able to identify movement.  Visual acuity and preserved fields is 20/70.  Visual acuity of right eye is 20/30.  Patient states that he does not wear corrective lenses at baseline.  He states 3 weeks ago, his visual acuity from his left eye was normal.  On bedside ultrasound, there is no clear evidence of retinal detachment.  There are hyperechoic floaters suggestive of vitreous hemorrhage.  On measurement of intraocular pressures, right eye was 26 and left eye was 32.  He does not have any conjunctival injection.  He has not had any pain.  I do not suspect acute angle-closure glaucoma.  MRI brain did not show acute findings.  I do not suspect CVA.  Ophthalmology was consulted.  On his urinalysis, he does have nitrite positive bacteria and pyuria.  Patient was given ciprofloxacin for empiric treatment of UTI.  Yeast were also present and Diflucan was ordered.  I spoke with ophthalmologist on-call, Dr. Stacie Glaze, who states that she  will be in her office and that patient should be there at 8:00 in the morning for further eye exam.  Patient was given office address and contact information.  He was advised to go there first thing in the morning.  He is also advised to follow-up with urology.  He is unclear if he has seen urology.  Per chart review, he had Foley placed last summer due to urine retention.  Contact information for urology was provided as well.  Patient was discharged in stable condition. I ordered medication including ciprofloxacin and Diflucan for treatment of UTI Reevaluation of the patient after these medicines showed that the patient stayed the same I have reviewed the patients home medicines and have made adjustments as needed   Social Determinants of Health:  Resides at [facility]         Final Clinical Impression(s) / ED Diagnoses Final diagnoses:  Cystitis  Decreased visual acuity    Rx / DC Orders ED Discharge Orders          Ordered    ciprofloxacin (CIPRO) 500 MG tablet  Every 12 hours        01/02/23 2129    fluconazole (DIFLUCAN) 100 MG tablet  Daily        01/02/23 2137              Godfrey Pick, MD 01/02/23 2353

## 2023-01-02 NOTE — ED Triage Notes (Addendum)
Patient BIB [facility] from [facility] [employer]. Left eye pain for 3 weeks. Today woke up and has no peripheral vision and pressure in his eye. No falls. Patient said he only can see from my neck up and the rest is black.

## 2023-01-02 NOTE — ED Provider Triage Note (Signed)
Emergency Medicine Provider Triage Evaluation Note  Devon Burrow. , a 72 y.o. male  was evaluated in triage.  Pt complains of vision changes x 3 weeks worse since waking up this morning and now associated with tingling of left face. Reports loss of lower visual fields to left eye only. He denies chest pain, SOB, nausea, vomiting, diarrhea, cough, congestion, fever, abdominal pain, headache, weakness, or other complaints. He denies taking blood thinners. He denies history of stroke. He denies head injury.   Review of Systems  Positive: See HPI Negative: See HPI  Physical Exam  BP 111/81 (BP Location: Left Arm)   Pulse 89   Temp 97.8 F (36.6 C) (Oral)   Resp 17   SpO2 92%  Gen:   Slightly drowsy but easily arousable to verbal stimuli Resp:  Normal effort LCTA MSK:   Moves extremities without difficulty  Other:  Reports loss of ~50% of lower vision field to left eye, reports decreased sensation to left face, 5/5 strength to bilateral UE and LE, CN otherwise intact, normal speech, alert and oriented to person and time but not to place, regular rate and rhythm, abdomen soft and non-tender, urinary catheter draining clear, yellow urine  Medical Decision Making  Medically screening exam initiated at 2:41 PM.  Appropriate orders placed.  Devon Burrow. was informed that the remainder of the evaluation will be completed by another provider, this initial triage assessment does not replace that evaluation, and the importance of remaining in the ED until their evaluation is complete.     Suzzette Righter, PA-C 01/02/23 1444

## 2023-01-02 NOTE — ED Notes (Signed)
Labeled specimen cup given to pt for U/A collection per MD order. ENMiles 

## 2023-01-03 LAB — URINE CULTURE

## 2024-10-21 ENCOUNTER — Encounter: Payer: Self-pay | Admitting: Neurology

## 2024-10-21 ENCOUNTER — Ambulatory Visit: Admitting: Neurology

## 2024-10-21 VITALS — BP 107/77 | HR 102 | Ht 73.0 in | Wt 165.0 lb

## 2024-10-21 DIAGNOSIS — Z5181 Encounter for therapeutic drug level monitoring: Secondary | ICD-10-CM | POA: Diagnosis not present

## 2024-10-21 DIAGNOSIS — R569 Unspecified convulsions: Secondary | ICD-10-CM | POA: Diagnosis not present

## 2024-10-21 NOTE — Patient Instructions (Signed)
 Will obtain a Keppra level today Routine EEG Please continue to follow-up with PCP If no additional event and a normal EEG, consider discontinuing Keppra at the 1 year mark.   Return as needed

## 2024-10-21 NOTE — Progress Notes (Signed)
 [facility]  PATIENT: Devon Wall Devon Wall. DOB: 10/04/1951  REQUESTING CLINICIAN: Ascencion Camelia HERO, NP-C HISTORY FROM: Patient  REASON FOR VISIT: Possible seizure    HISTORICAL  CHIEF COMPLAINT:  Chief Complaint  Patient presents with   New Patient (Initial Visit)    Pt in room 12. Alone. Paper seizure like activity.    HISTORY OF PRESENT ILLNESS:  This is 73 year old gentleman past medical history of diabetes, lung disease, insomnia who is presenting for evaluation of a possible seizure.  Patient was referred by PCP for reported seizures in July.  He is alone today, tells me that he does not remember the event, never had a seizure before, not even sure if he even had a seizure. He does not remember going to the hospital.  Again the history is extremely limited.  He was started on Keppra and per chart review, I could not find any previous EEG and he did have a MRI brain in July 2024 which was normal, no acute abnormality.  Again denies any previous seizures, denies any family history of seizures, and denies any history of stroke or head injury.    OTHER MEDICAL CONDITIONS: Diabetes, lung disease, insomnia   REVIEW OF SYSTEMS: Full 14 system review of systems performed and negative with exception of: As noted in the HPI   ALLERGIES: No Known Allergies  HOME MEDICATIONS: Outpatient Medications Prior to Visit  Medication Sig Dispense Refill   acetaminophen  (TYLENOL ) 325 MG tablet Take 650 mg by mouth every 6 (six) hours as needed.     cholecalciferol (VITAMIN D3) 25 MCG (1000 UNIT) tablet Take 1,000 Units by mouth daily.     diclofenac Sodium (VOLTAREN) 1 % GEL Apply 2 g topically 2 (two) times daily.     levETIRAcetam (KEPPRA) 250 MG tablet Take 250 mg by mouth 2 (two) times daily.     lidocaine (HM LIDOCAINE PATCH) 4 % Place 1 patch onto the skin daily.     metFORMIN (GLUCOPHAGE) 500 MG tablet Take 500 mg by mouth daily with breakfast.     oxyCODONE  (OXY  IR/ROXICODONE ) 5 MG immediate release tablet Take 5 mg by mouth every 4 (four) hours as needed for severe pain (pain score 7-10).     senna-docusate (SENOKOT-S) 8.6-50 MG tablet Take 2 tablets by mouth every 12 (twelve) hours.     tamsulosin  (FLOMAX ) 0.4 MG CAPS capsule Take 0.4 mg by mouth daily.     traZODone (DESYREL) 50 MG tablet Take 50 mg by mouth at bedtime.     albuterol  (PROVENTIL  HFA;VENTOLIN  HFA) 108 (90 BASE) MCG/ACT inhaler Inhale 2 puffs into the lungs every 6 (six) hours as needed for wheezing or shortness of breath. (Patient not taking: Reported on 10/21/2024) 1 Inhaler 2   feeding supplement (ENSURE ENLIVE / ENSURE PLUS) LIQD Take 237 mLs by mouth 3 (three) times daily between meals. (Patient not taking: Reported on 10/21/2024) 237 mL 12   No facility-administered medications prior to visit.    PAST MEDICAL HISTORY: Past Medical History:  Diagnosis Date   Arthritis    Sepsis due to pneumonia (HCC) 11/10/2015    PAST SURGICAL HISTORY: History reviewed. No pertinent surgical history.  FAMILY HISTORY: Family History  Problem Relation Age of Onset   Hypertension Other     SOCIAL HISTORY: Social History   Socioeconomic History   Marital status: Single    Spouse name: Not on file   Number of children: Not on file   Years of  education: Not on file   Highest education level: Not on file  Occupational History   Not on file  Tobacco Use   Smoking status: Former    Current packs/day: 0.00    Average packs/day: 1 pack/day for 3.0 years (3.0 ttl pk-yrs)    Types: Cigarettes    Start date: 12/19/1966    Quit date: 12/19/1969    Years since quitting: 54.8   Smokeless tobacco: Not on file  Substance and Sexual Activity   Alcohol use: No   Drug use: Yes    Types: Marijuana   Sexual activity: Not on file  Other Topics Concern   Not on file  Social History Narrative   ** Merged History Encounter **       Social Drivers of Corporate Investment Banker Strain: Not on  file  Food Insecurity: Not on file  Transportation Needs: Not on file  Physical Activity: Not on file  Stress: Not on file  Social Connections: Not on file  Intimate Partner Violence: Not on file    PHYSICAL EXAM  GENERAL EXAM/CONSTITUTIONAL: Vitals:  Vitals:   10/21/24 1252  BP: 107/77  Pulse: (!) 102  Weight: 165 lb (74.8 kg)  Height: 6' 1 (1.854 m)   Body mass index is 21.77 kg/m. Wt Readings from Last 3 Encounters:  10/21/24 165 lb (74.8 kg)  01/02/23 160 lb (72.6 kg)  06/02/22 110 lb (49.9 kg)   Patient is in no distress; well developed, nourished and groomed; neck is supple  MUSCULOSKELETAL: Gait, strength, tone, movements noted in Neurologic exam below  NEUROLOGIC: MENTAL STATUS:      No data to display         awake, alert, oriented to person, place and time Difficulty with remote history normal attention and concentration language fluent, comprehension intact, naming intact fund of knowledge appropriate  CRANIAL NERVE:  2nd, 3rd, 4th, 6th - Visual fields full to confrontation, extraocular muscles intact, no nystagmus 5th - facial sensation symmetric 7th - facial strength symmetric 8th - hearing intact 9th - palate elevates symmetrically, uvula midline 11th - shoulder shrug symmetric 12th - tongue protrusion midline  MOTOR:  normal bulk and tone, full strength in the BUE, BLE  SENSORY:  normal and symmetric to light touch  COORDINATION:  finger-nose-finger, fine finger movements normal  GAIT/STATION:  Using a wheelchair able to stand and walk     DIAGNOSTIC DATA (LABS, IMAGING, TESTING) - I reviewed patient records, labs, notes, testing and imaging myself where available.  Lab Results  Component Value Date   WBC 5.7 01/02/2023   HGB 14.9 01/02/2023   HCT 45.9 01/02/2023   MCV 92.4 01/02/2023   PLT 173 01/02/2023      Component Value Date/Time   NA 137 01/02/2023 1553   K 4.2 01/02/2023 1553   CL 103 01/02/2023 1553   CO2  26 01/02/2023 1553   GLUCOSE 118 (H) 01/02/2023 1553   BUN 16 01/02/2023 1553   CREATININE 0.82 01/02/2023 1553   CALCIUM 9.1 01/02/2023 1553   PROT 8.7 (H) 01/02/2023 1553   ALBUMIN 3.6 01/02/2023 1553   AST 100 (H) 01/02/2023 1553   ALT 93 (H) 01/02/2023 1553   ALKPHOS 113 01/02/2023 1553   BILITOT 0.4 01/02/2023 1553   GFRNONAA >60 01/02/2023 1553   GFRAA >60 04/08/2018 2236   No results found for: CHOL, HDL, LDLCALC, LDLDIRECT, TRIG, CHOLHDL No results found for: YHAJ8R Lab Results  Component Value Date   VITAMINB12 674  10/25/2016   No results found for: TSH  MRI Brain 01/02/2023 No acute intracranial pathology     ASSESSMENT AND PLAN  73 y.o. year old male with history of lung disease, insomnia, diabetes who is presenting for a possible seizure.  His history is extremely limited, no collateral history and even per chart review no description of the seizure, no previous EEG but he did have a normal MRI brain in January 2024.  He is already on Keppra 500 mg nightly.  Plan will be to obtain a routine EEG and a Keppra level.  If EEG is normal and patient does not have any additional events concerning for seizures, I think is reasonable to discontinue the Keppra at the 1 year mark.  Continue to follow PCP return as needed   1. Seizure-like activity (HCC)   2. Therapeutic drug monitoring      Patient Instructions  Will obtain a Keppra level today Routine EEG Please continue to follow-up with PCP If no additional event and a normal EEG, consider discontinuing Keppra at the 1 year mark.   Return as needed   Orders Placed This Encounter  Procedures   Levetiracetam level   EEG adult    No orders of the defined types were placed in this encounter.   Return if symptoms worsen or fail to improve.    Pastor Falling, MD 10/21/2024, 1:45 PM  [facility] [address] [place], [place] [address] [phone]

## 2024-10-23 ENCOUNTER — Ambulatory Visit: Payer: Self-pay | Admitting: Neurology

## 2024-10-23 LAB — LEVETIRACETAM LEVEL: Levetiracetam Lvl: 7.7 ug/mL — ABNORMAL LOW (ref 10.0–40.0)

## 2024-10-23 NOTE — Telephone Encounter (Signed)
-----   Message from [facility] sent at 10/23/2024 10:25 AM EST ----- Please call and advise the patient that the recent Keppra level was low but it reflect the lower dose that you are taking. No further action is required on these tests at this time. Please remind  patient to keep any upcoming appointments or tests and to call us  with any interim questions, concerns, problems or updates. Thanks,   Pastor Falling, MD   ----- Message ----- From: Rebecka Memos Lab Results In Sent: 10/23/2024   9:36 AM EST To: Pastor Falling, MD

## 2024-10-23 NOTE — Progress Notes (Signed)
 Please call and advise the patient that the recent Keppra level was low but it reflect the lower dose that you are taking. No further action is required on these tests at this time. Please remind patient to keep any upcoming appointments or tests and to call us  with any interim questions, concerns, problems or updates. Thanks,   Pastor Falling, MD

## 2024-10-23 NOTE — Telephone Encounter (Signed)
 LVM !ST ATTEMPT BY HF 10/23/24  Attempted to call pt on hi own # and a voice prompt came on the line that stated I'm sorry but your call cannot be completed at this time.

## 2024-10-31 ENCOUNTER — Other Ambulatory Visit: Admitting: *Deleted

## 2024-10-31 ENCOUNTER — Encounter: Payer: Self-pay | Admitting: *Deleted
